# Patient Record
Sex: Female | Born: 1975 | Race: Black or African American | Hispanic: No | Marital: Single | Smoking: Current every day smoker
Health system: Southern US, Community
[De-identification: ages and names within clinical notes are randomized; demographics above are authoritative.]

## PROBLEM LIST (undated history)

## (undated) VITALS — BP 125/79 | HR 108 | Temp 98.0°F | Resp 16 | Ht 63.0 in | Wt 117.0 lb

## (undated) DIAGNOSIS — F329 Major depressive disorder, single episode, unspecified: Secondary | ICD-10-CM

## (undated) DIAGNOSIS — F32A Depression, unspecified: Secondary | ICD-10-CM

## (undated) DIAGNOSIS — F419 Anxiety disorder, unspecified: Secondary | ICD-10-CM

---

## 2005-01-27 ENCOUNTER — Emergency Department (HOSPITAL_COMMUNITY): Admission: EM | Admit: 2005-01-27 | Discharge: 2005-01-27 | Payer: Self-pay | Admitting: Emergency Medicine

## 2005-02-19 ENCOUNTER — Ambulatory Visit: Payer: Self-pay | Admitting: Family Medicine

## 2005-11-09 ENCOUNTER — Emergency Department (HOSPITAL_COMMUNITY): Admission: EM | Admit: 2005-11-09 | Discharge: 2005-11-09 | Payer: Self-pay | Admitting: Emergency Medicine

## 2005-12-05 ENCOUNTER — Inpatient Hospital Stay (HOSPITAL_COMMUNITY): Admission: AD | Admit: 2005-12-05 | Discharge: 2005-12-05 | Payer: Self-pay | Admitting: Obstetrics and Gynecology

## 2005-12-15 ENCOUNTER — Inpatient Hospital Stay (HOSPITAL_COMMUNITY): Admission: AD | Admit: 2005-12-15 | Discharge: 2005-12-15 | Payer: Self-pay | Admitting: Obstetrics

## 2006-01-30 ENCOUNTER — Inpatient Hospital Stay (HOSPITAL_COMMUNITY): Admission: AD | Admit: 2006-01-30 | Discharge: 2006-01-30 | Payer: Self-pay | Admitting: Obstetrics and Gynecology

## 2006-02-14 ENCOUNTER — Ambulatory Visit (HOSPITAL_COMMUNITY): Admission: RE | Admit: 2006-02-14 | Discharge: 2006-02-14 | Payer: Self-pay | Admitting: Obstetrics and Gynecology

## 2006-03-30 ENCOUNTER — Inpatient Hospital Stay (HOSPITAL_COMMUNITY): Admission: AD | Admit: 2006-03-30 | Discharge: 2006-03-31 | Payer: Self-pay | Admitting: Obstetrics and Gynecology

## 2006-04-01 ENCOUNTER — Ambulatory Visit: Payer: Self-pay | Admitting: Obstetrics and Gynecology

## 2006-04-01 ENCOUNTER — Inpatient Hospital Stay (HOSPITAL_COMMUNITY): Admission: AD | Admit: 2006-04-01 | Discharge: 2006-04-02 | Payer: Self-pay | Admitting: *Deleted

## 2006-04-16 ENCOUNTER — Ambulatory Visit: Payer: Self-pay | Admitting: Gynecology

## 2006-04-16 ENCOUNTER — Inpatient Hospital Stay (HOSPITAL_COMMUNITY): Admission: AD | Admit: 2006-04-16 | Discharge: 2006-04-24 | Payer: Self-pay | Admitting: Obstetrics & Gynecology

## 2006-04-24 ENCOUNTER — Ambulatory Visit: Payer: Self-pay | Admitting: Internal Medicine

## 2006-06-10 ENCOUNTER — Emergency Department (HOSPITAL_COMMUNITY): Admission: EM | Admit: 2006-06-10 | Discharge: 2006-06-10 | Payer: Self-pay | Admitting: Family Medicine

## 2007-05-03 IMAGING — US US OB FOLLOW-UP
1 series · 13 of 28 positions shown · non-contrast
Comparison: none

CLINICAL DATA: 26 weeks pregnant, fluid leaking per vagina.

[Series 1: us ob follow-up · 0.22mm/px · 13 of 53 slices shown]
[im 2/53]
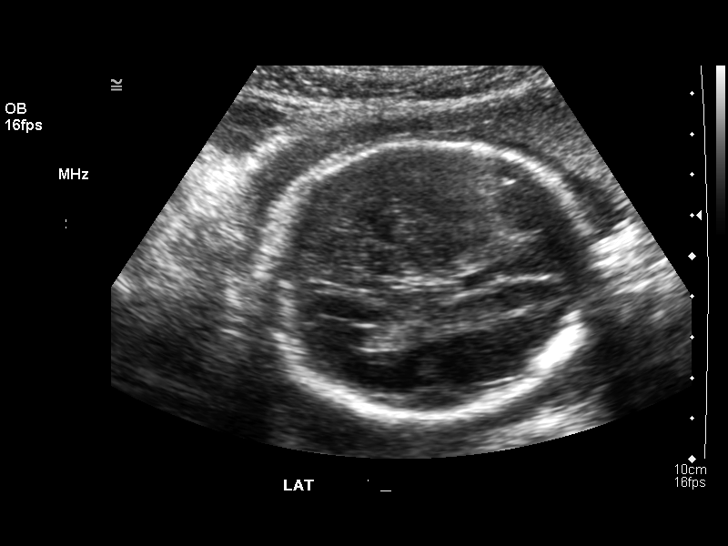
[im 6/53]
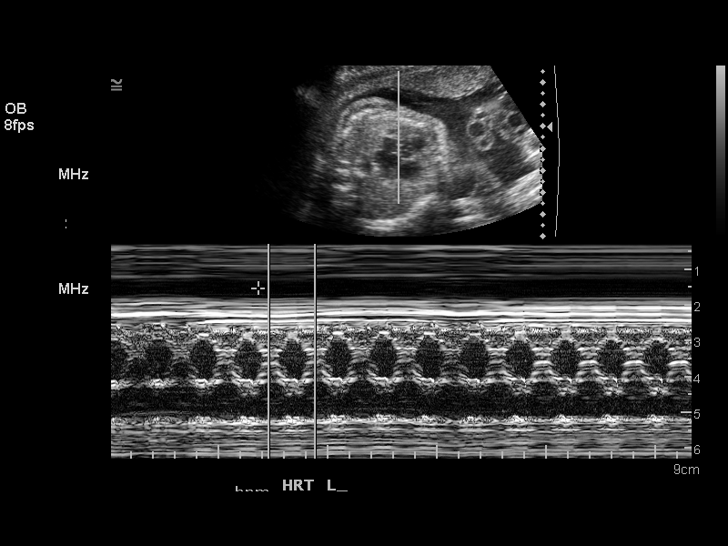
[im 10/53]
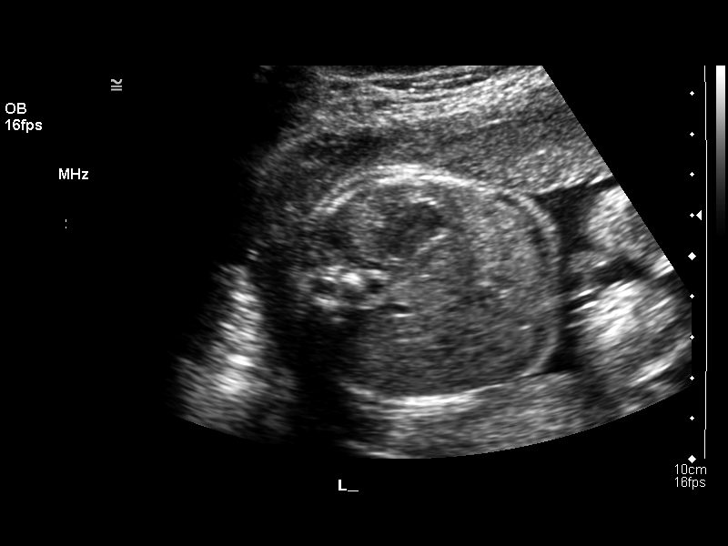
[im 14/53]
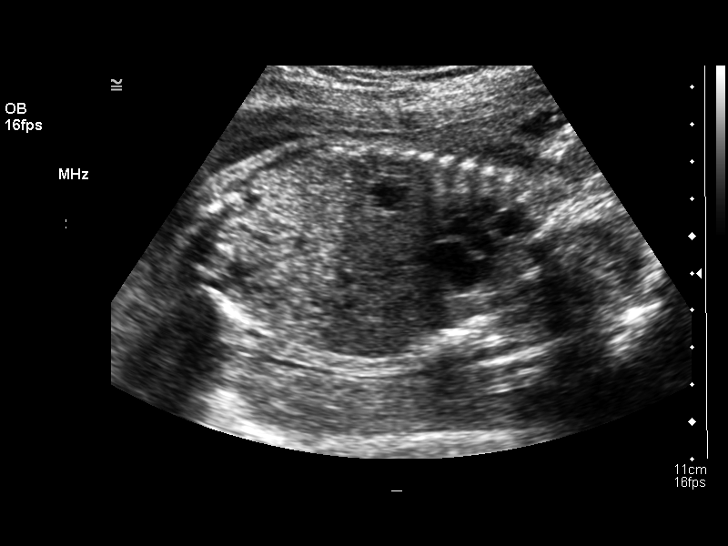
[im 18/53]
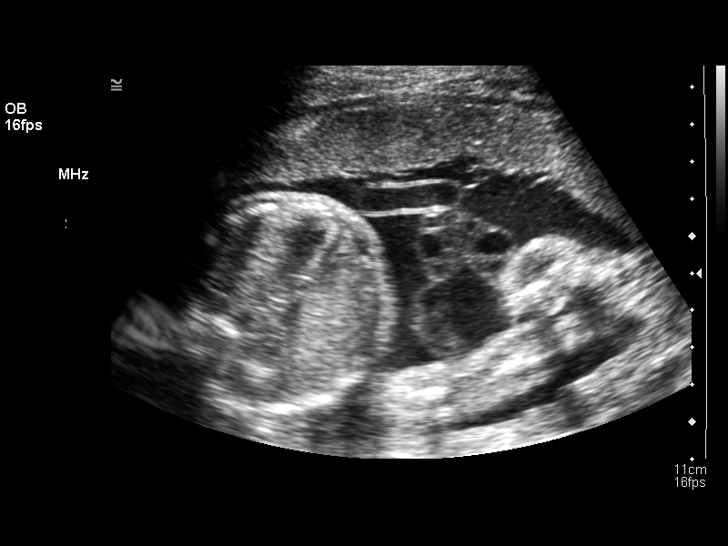
[im 22/53]
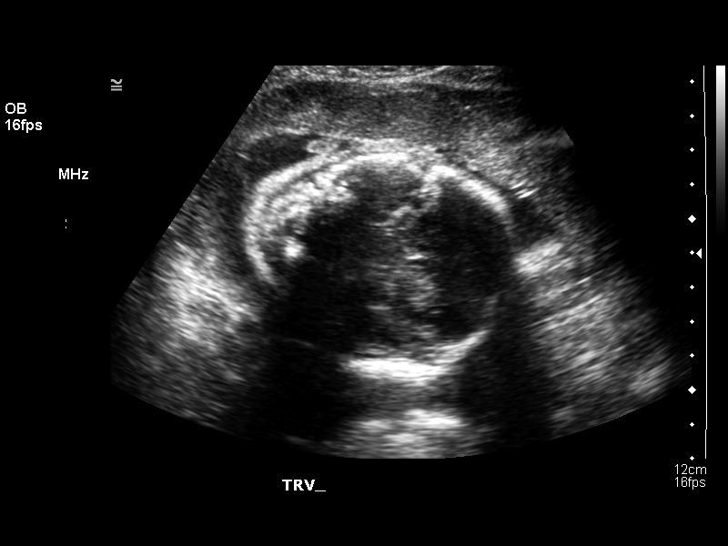
[im 27/53]
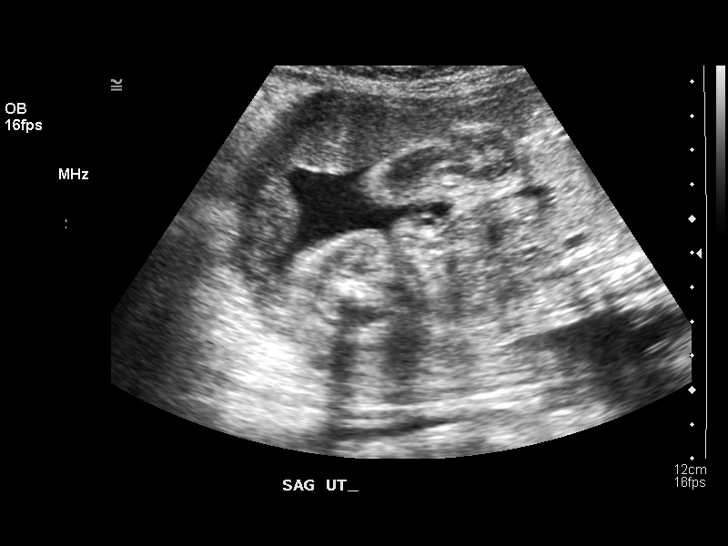
[im 31/53]
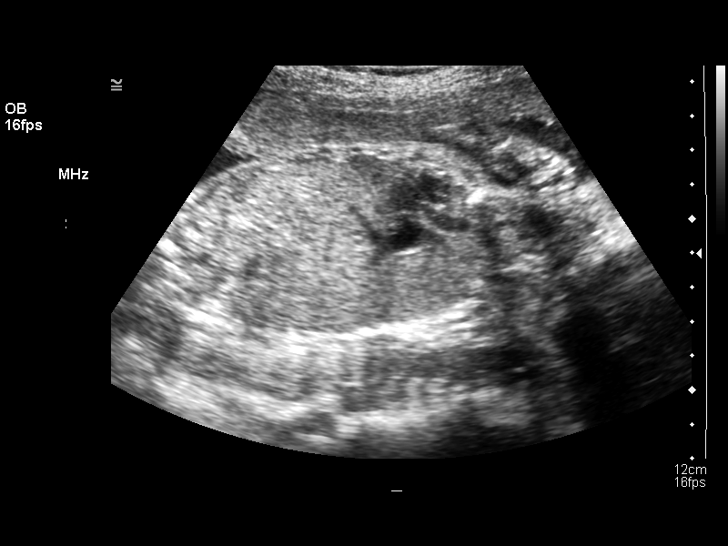
[im 35/53]
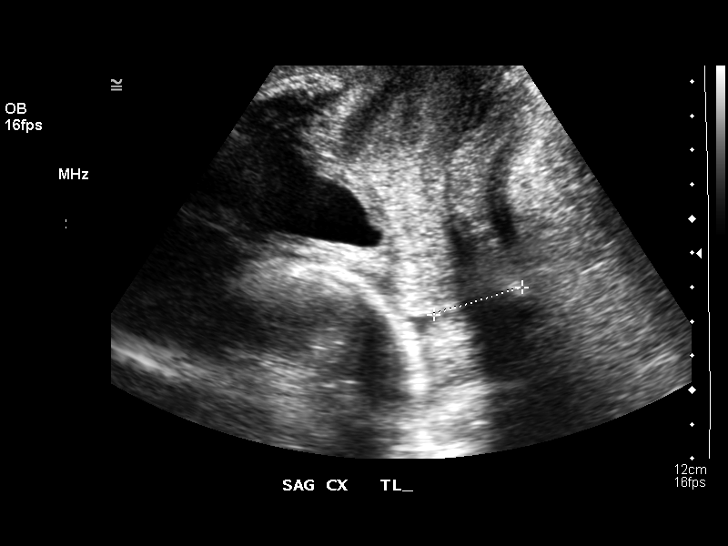
[im 39/53]
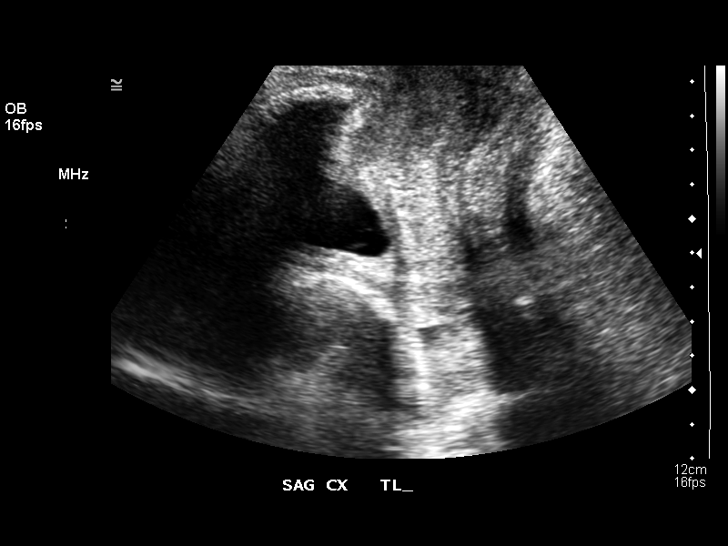
[im 43/53]
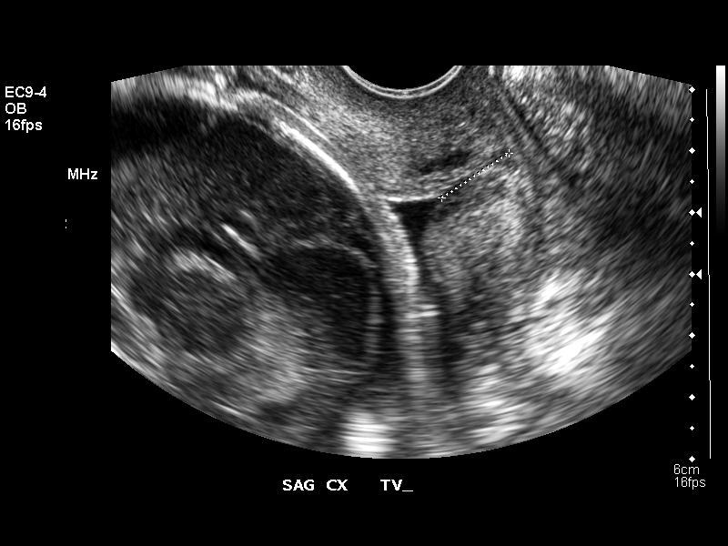
[im 47/53]
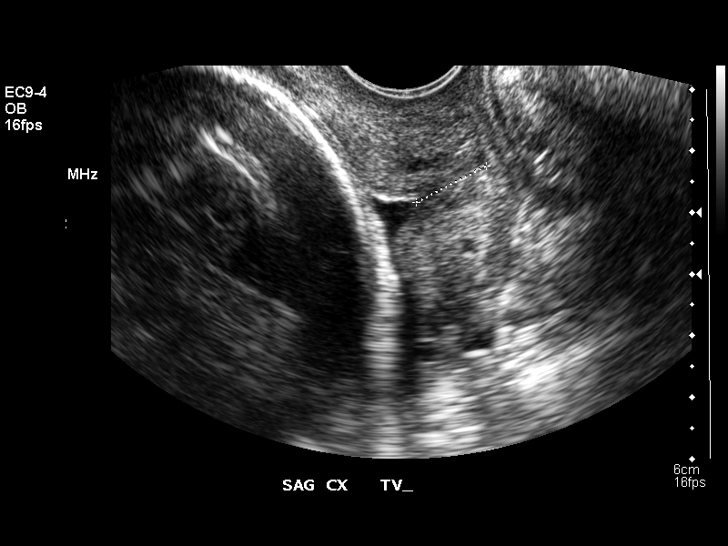
[im 51/53]
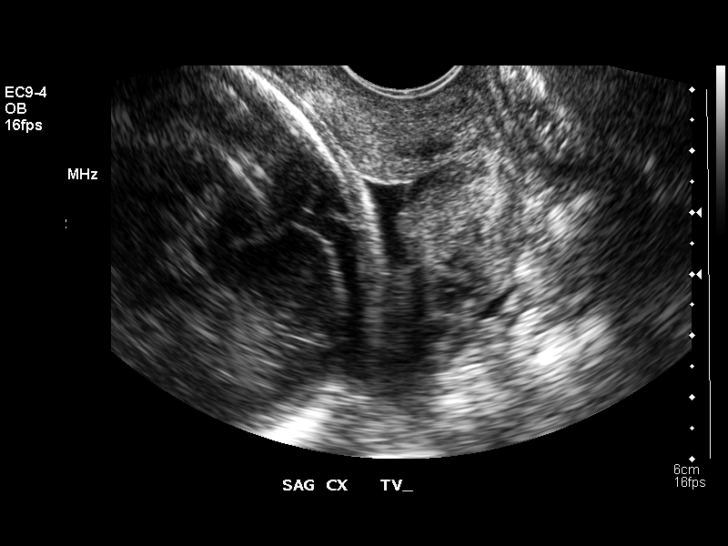

[13 of 28 positions shown; findings below may reference images not displayed]

OBSTETRICAL ULTRASOUND RE-EVALUATION WITH TRANSVAGINAL:
Number of Fetuses:  1
Heart Rate:  141
Movement:  Yes
Breathing:  No
Presentation:  Cephalic
Placental Location:  Anterior
Grade:  I
Previa:  No
Amniotic Fluid (subjective):  Normal
Amniotic Fluid (objective):  11.4 cm AFI (5th -95th%ile = 9.7 ? 22.3 cm for 26 wks)

FETAL BIOMETRY
BPD:  6.5 cm   26 w 2 d
HC:  24.9 cm   27 w 0 d
AC:   21.4 cm  25 w 6 d
FL:  4.9 cm   26 w 3 d

Mean GA:  26 w 3 d  US EDC:  07/03/06
Assigned GA:  26 w 0 d  Assigned EDC:  07/06/06

EFW:  908 g 

FETAL ANATOMY
Lateral Ventricles:  Visualized 
Thalami/CSP:  Visualized 
Posterior Fossa:  Previously seen 
Nuchal Region:  N/A
Spine:  Previously seen 
4 Chamber Heart on Left:  Previously seen 
Stomach on Left:  Visualized 
3 Vessel Cord:  Previously seen 
Cord Insertion Site:  Previously seen 
Kidneys:  Visualized 
Bladder:  Visualized 
Extremities:  Previously seen 

MATERNAL UTERINE AND ADNEXAL FINDINGS
Cervix:  1.3 ? 1.5 cm Transvaginally
Anterior fundal fibroid measuring 2.4 x 1.6 x 1.4 cm.
IMPRESSION: 1.  Single live intrauterine gestation in cephalic presentation with average ultrasound age of 26 weeks 3 days corresponding to an estimated due date of 07/03/06.  This is concordant with the gestational age by first ultrasound of 26 weeks 0 days (EDC by first ultrasound 07/06/06).  
2.  The cervix is shortened to a maximal length of 1.3 cm with dynamic changes and funneling noted on transvaginal technique.

## 2007-05-20 IMAGING — US US OB LIMITED
1 series · 13 of 28 positions shown · non-contrast
Comparison: none

CLINICAL DATA: 28 week 3 day assigned gestational age by prior ultrasound.  Pregnancy induced hypertension.  Low platelets.  Evaluate amniotic fluid, BPP and fetal Doppler.

[Series 1: us ob limited · 0.35mm/px · 13 of 44 slices shown]
[im 2/44]
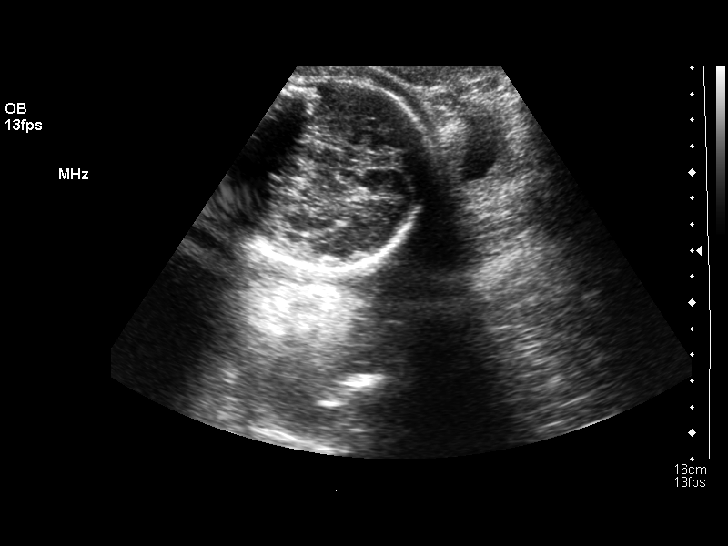
[im 5/44]
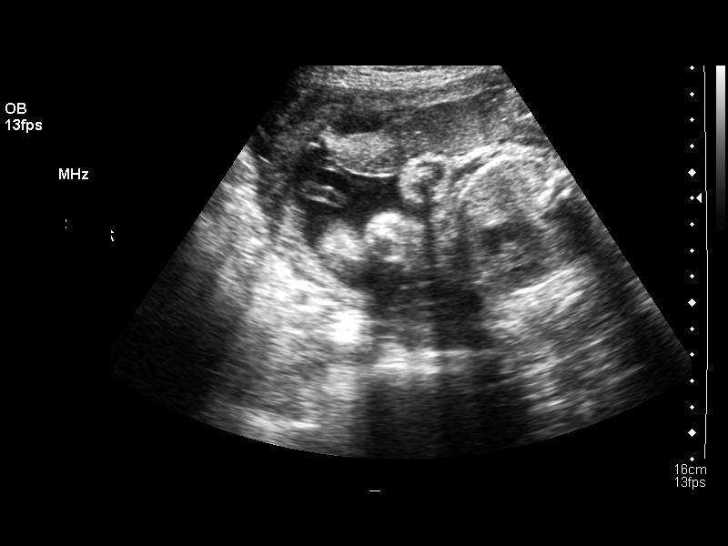
[im 8/44]
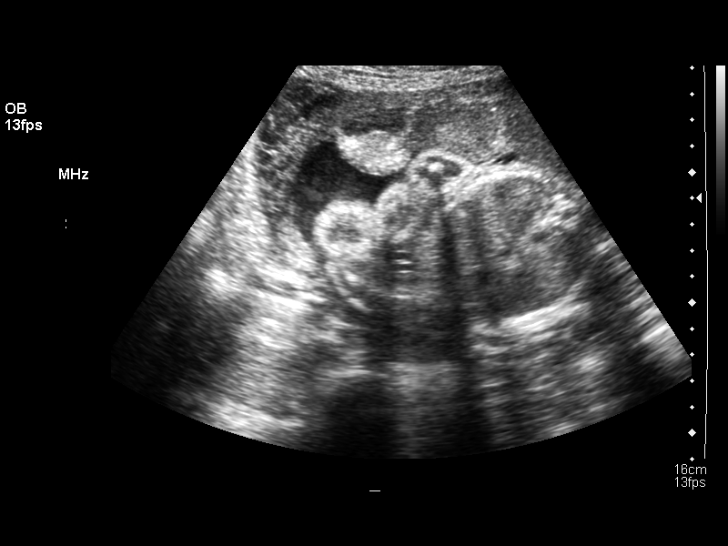
[im 12/44]
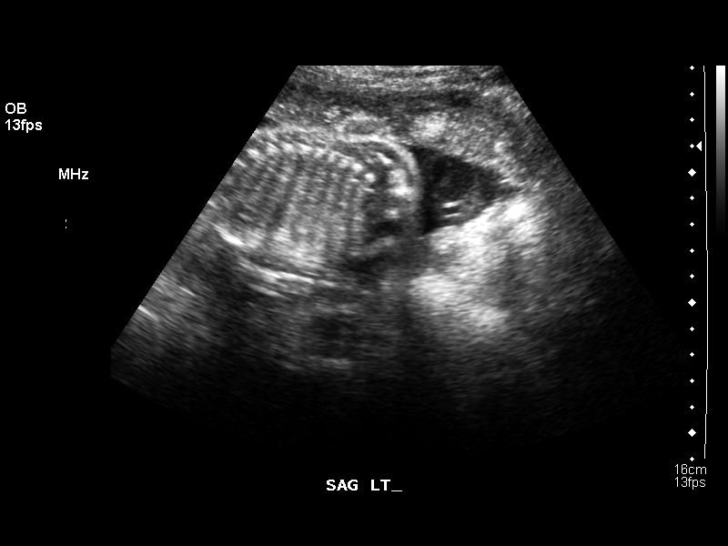
[im 15/44]
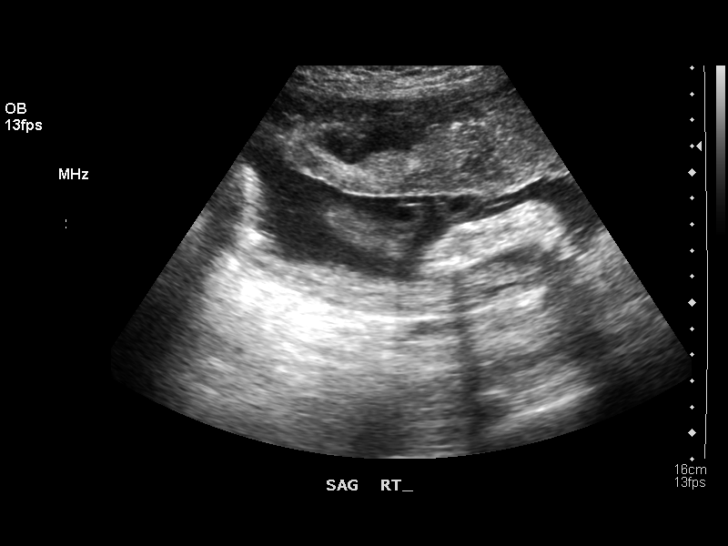
[im 18/44]
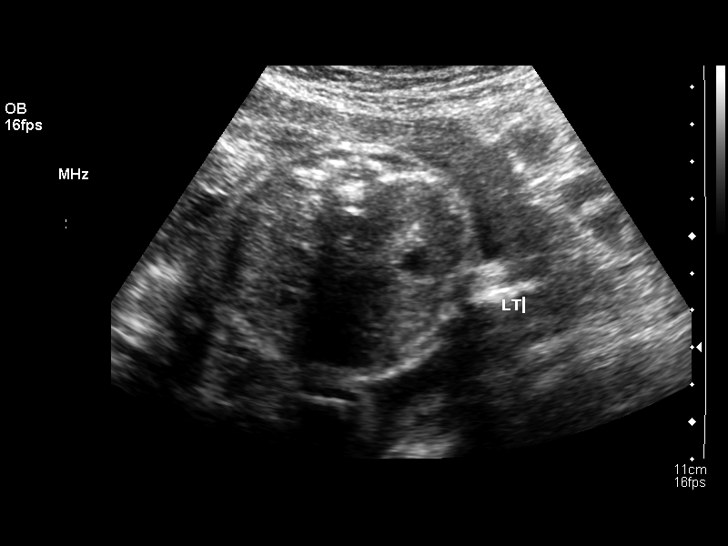
[im 23/44]
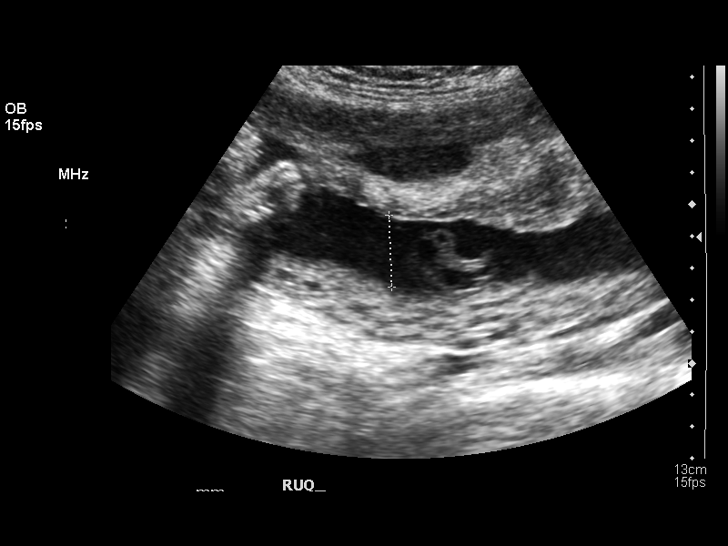
[im 26/44]
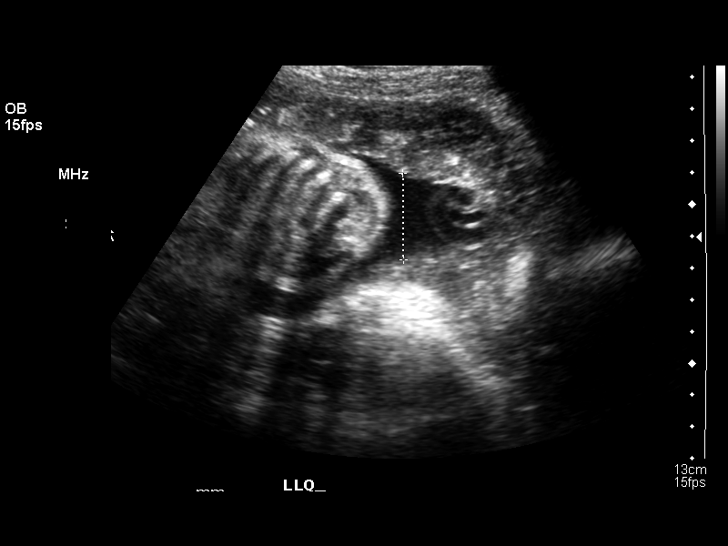
[im 29/44]
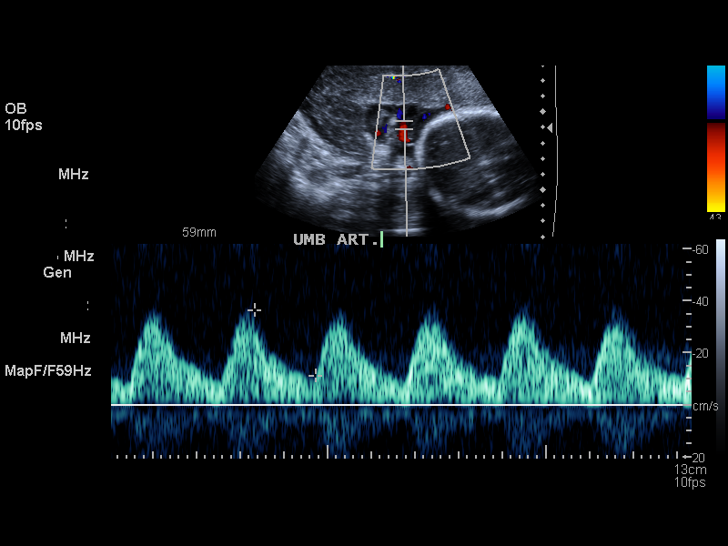
[im 32/44]
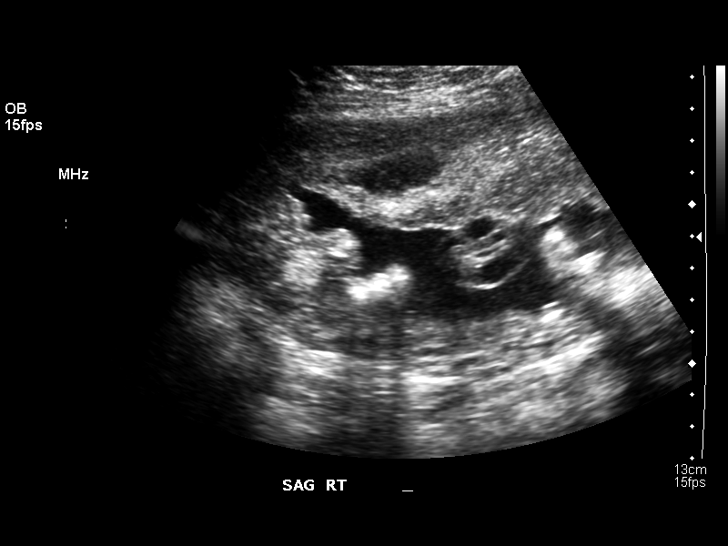
[im 36/44]
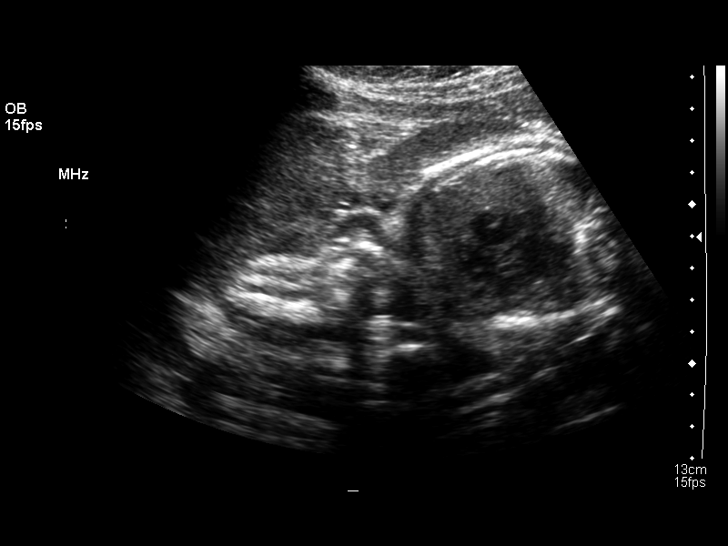
[im 39/44]
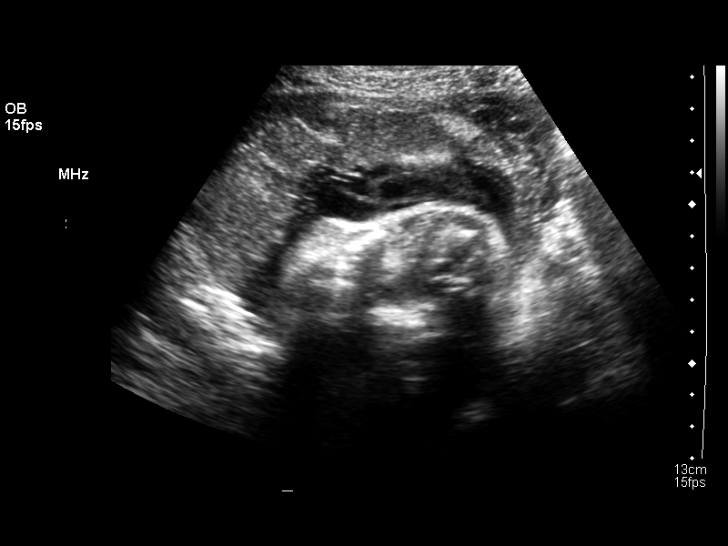
[im 42/44]
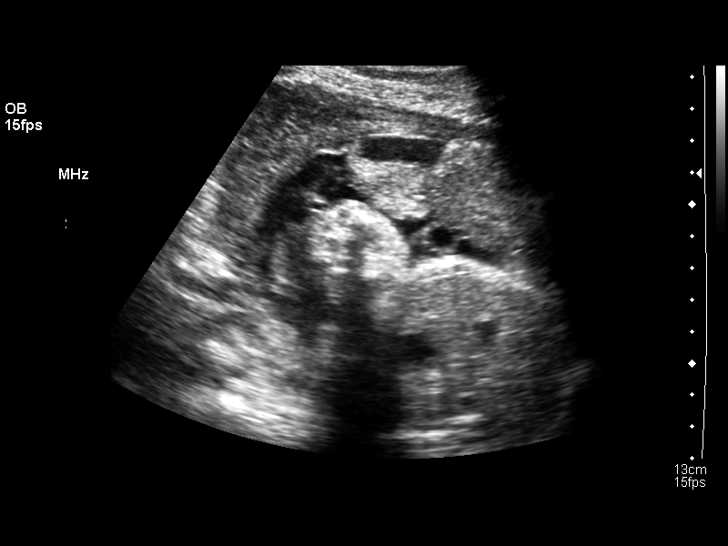

[13 of 28 positions shown; findings below may reference images not displayed]

LIMITED OBSTETRICAL ULTRASOUND:
Number of Fetuses:  1
Heart Rate:  131 BPM
Movement:  Yes
Breathing:  Yes
Presentation:  Cephalic
Placental Location:  Anterior
Grade:  I
Previa:  No
Amniotic Fluid (Subjective):  Borderline decreased 
Amniotic Fluid (Objective):  AFI 9.1 cm (3th-43th %ile = 9.4 to 22.8 cm for 28 weeks) 

Fetal measurements and complete anatomic evaluation were not requested.  The following fetal anatomy was visualized during this exam:  Lateral ventricles, 4-chamber heart, stomach, kidneys, and bladder.

MATERNAL UTERINE AND ADNEXAL FINDINGS
Cervix:  Not evaluated
IMPRESSION: Single living intrauterine fetus in cephalic presentation.  Borderline decreased amniotic fluid volume, with AFI of 9.1 cm which is at approximately 5th %ile for gestational age.

BIOPHYSICAL PROFILE

Movement:    2  Time:  20 minutes
Breathing:  2
Tone:  2
Amniotic Fluid:  2

Total Score:  8
IMPRESSION: Biophysical profile score of [DATE].
DOPPLER ULTRASOUND OF FETUS:

Umbilical Artery S/D Ratio:  3.3 (NL< 4.55)
IMPRESSION: Umbilical artery S/D ratio is within normal limits for gestational age.

## 2007-05-23 IMAGING — CR DG CHEST 2V
2 series · 2 of 2 positions shown · non-contrast
Comparison: none

CLINICAL DATA: Status post cesarean section.   Pregnancies.  Hypertension.  Fever of unknown origin.  
 CHEST - 2 VIEW:

[view not recorded (1 of 2)]
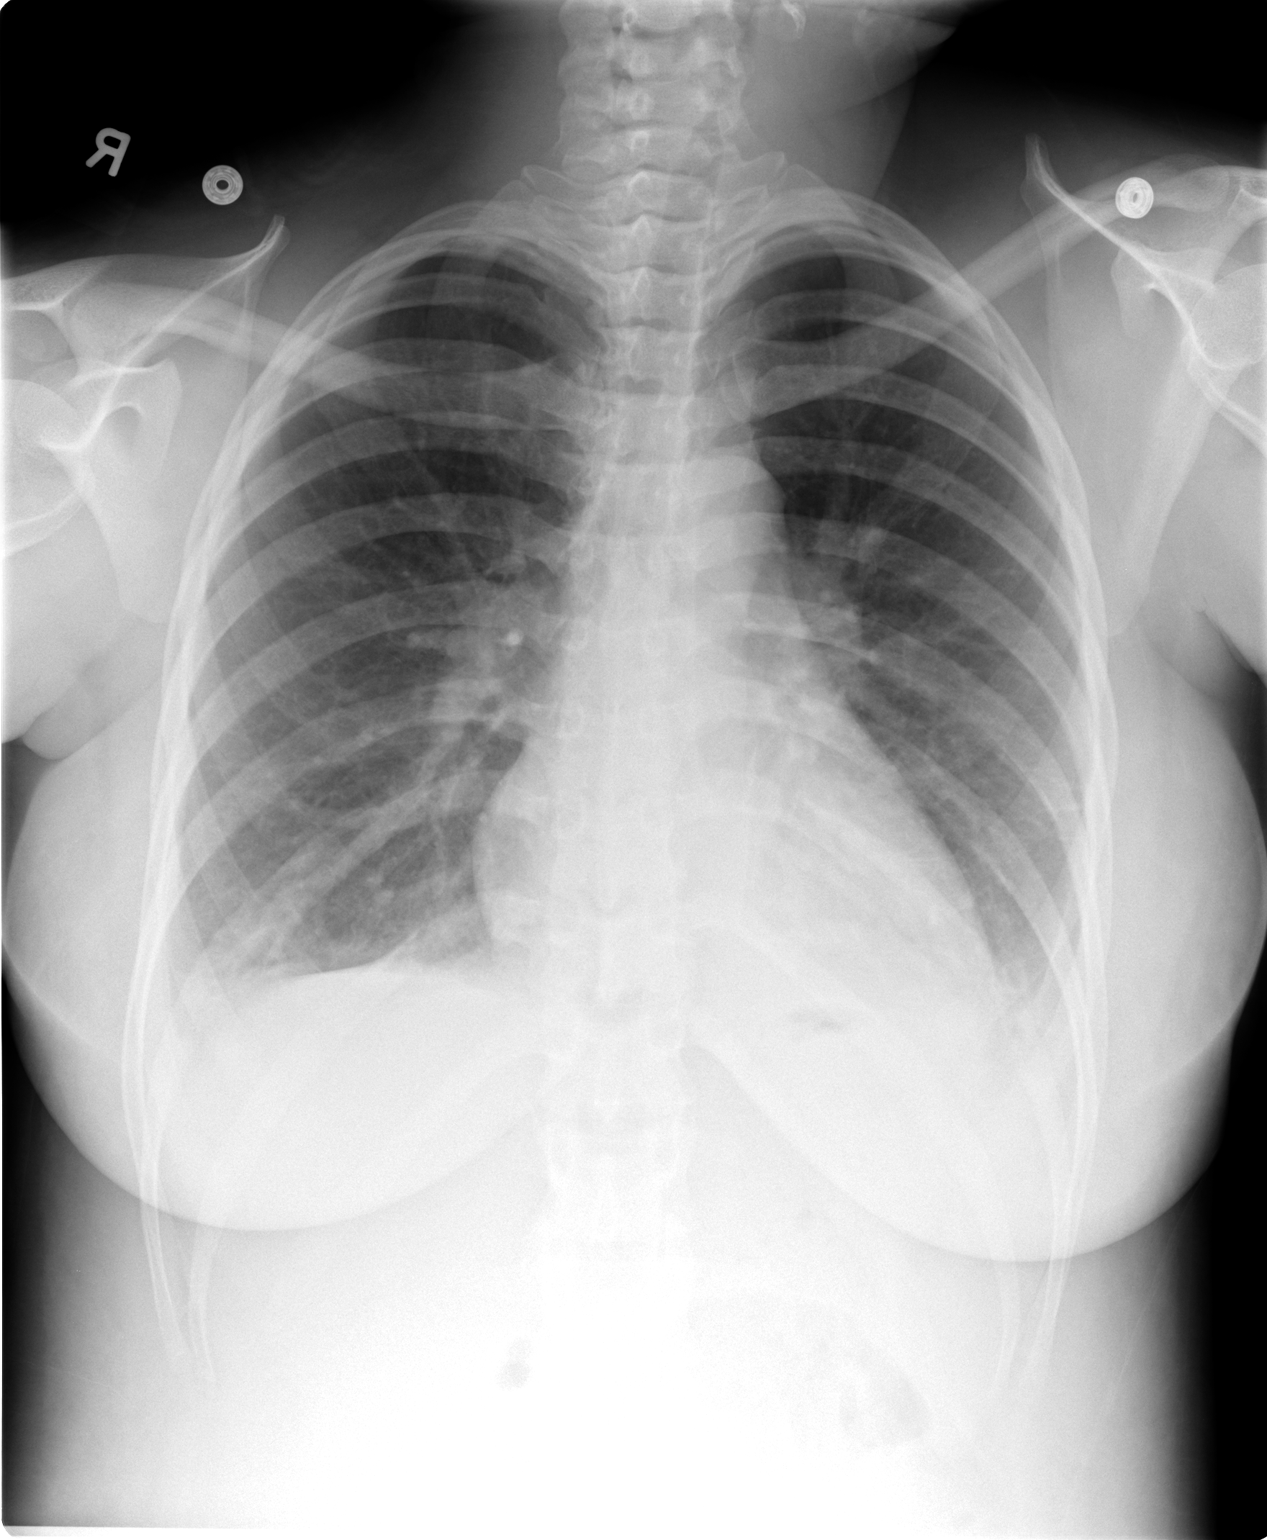

[view not recorded (2 of 2)]
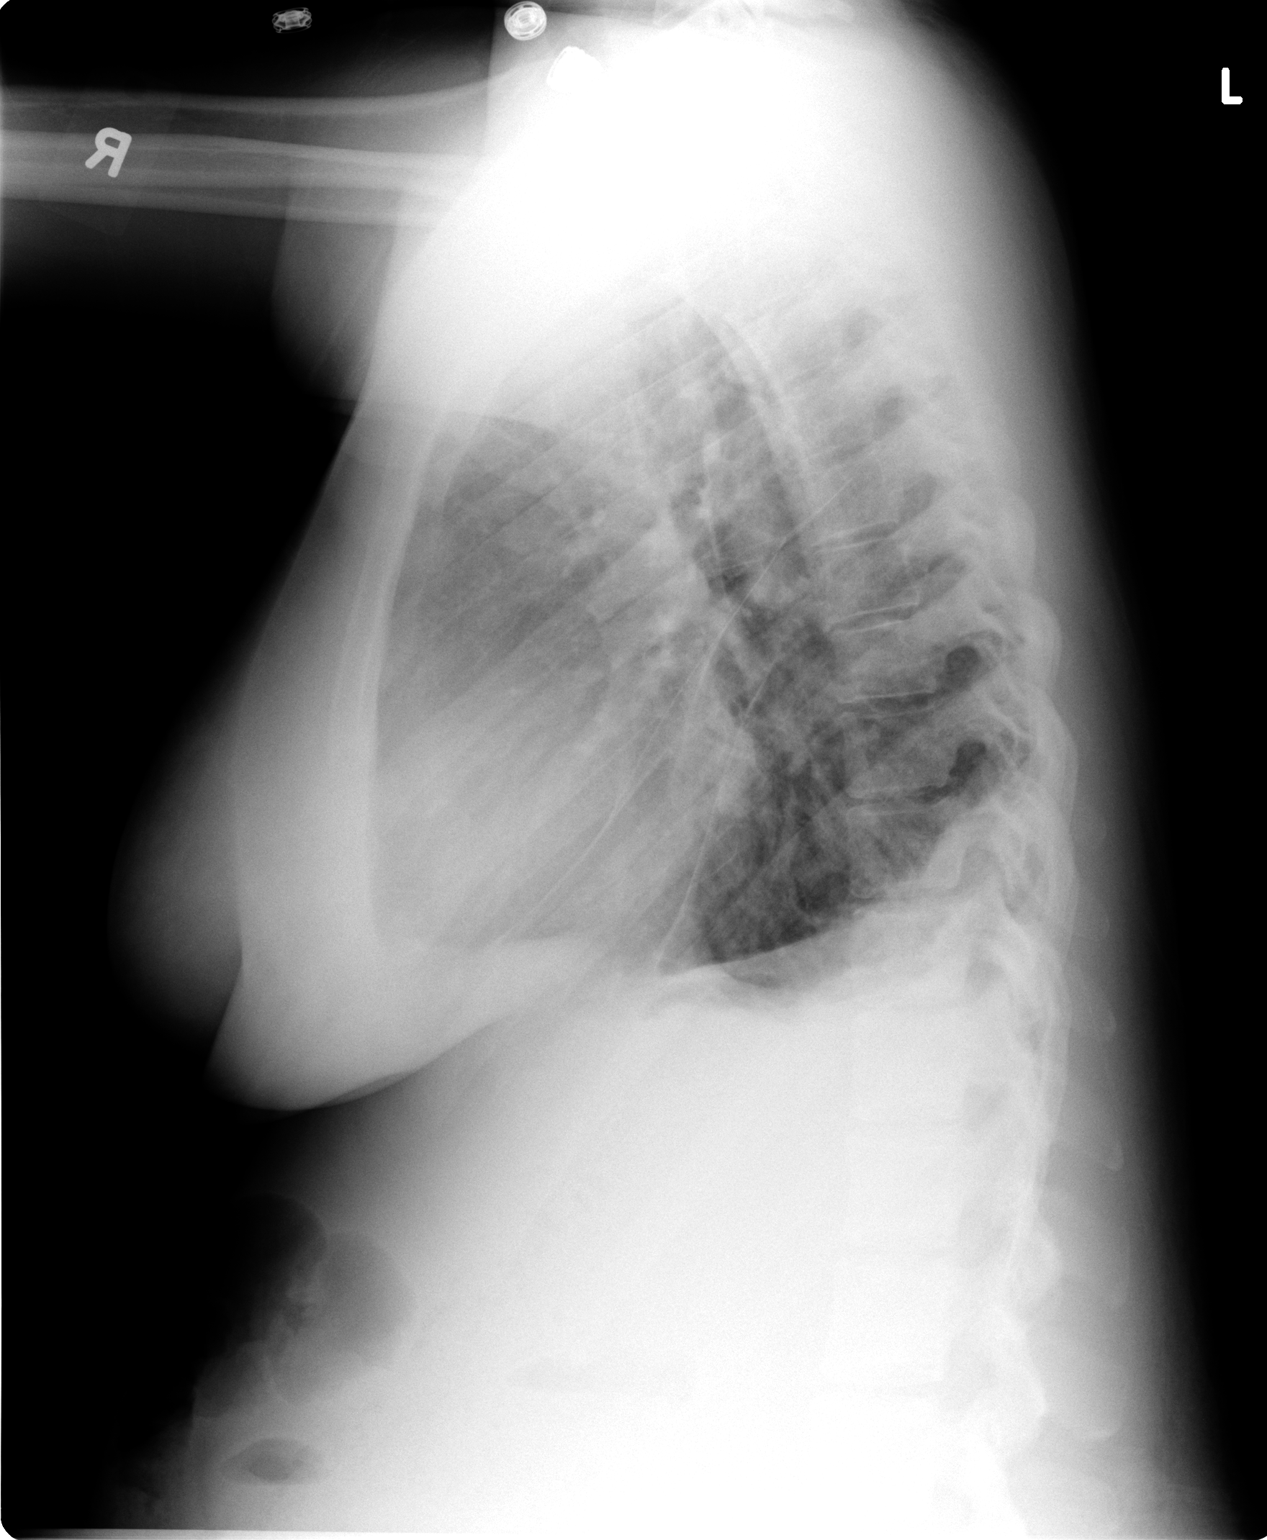

[2 of 2 positions shown; findings below may reference images not displayed]

FINDINGS: Small pleural effusions are present bilaterally.  Pulmonary vascular congestion and mild interstitial edema pattern is also noted.  Heart size is at the upper limits of normal.
IMPRESSION: Mild interstitial edema and small bilateral pleural effusions.  Borderline cardiomegaly.

## 2012-06-03 ENCOUNTER — Encounter (HOSPITAL_COMMUNITY): Payer: Self-pay | Admitting: *Deleted

## 2012-06-03 ENCOUNTER — Emergency Department (HOSPITAL_COMMUNITY)
Admission: EM | Admit: 2012-06-03 | Discharge: 2012-06-04 | Disposition: A | Discharge status: Other Acute Inpatient Hospital | Payer: Medicaid Other | Attending: Emergency Medicine | Admitting: Emergency Medicine

## 2012-06-03 DIAGNOSIS — S61509A Unspecified open wound of unspecified wrist, initial encounter: Secondary | ICD-10-CM | POA: Insufficient documentation

## 2012-06-03 DIAGNOSIS — F3289 Other specified depressive episodes: Secondary | ICD-10-CM | POA: Insufficient documentation

## 2012-06-03 DIAGNOSIS — F329 Major depressive disorder, single episode, unspecified: Secondary | ICD-10-CM | POA: Insufficient documentation

## 2012-06-03 DIAGNOSIS — T1491XA Suicide attempt, initial encounter: Secondary | ICD-10-CM

## 2012-06-03 DIAGNOSIS — X789XXA Intentional self-harm by unspecified sharp object, initial encounter: Secondary | ICD-10-CM | POA: Insufficient documentation

## 2012-06-03 DIAGNOSIS — R5381 Other malaise: Secondary | ICD-10-CM | POA: Insufficient documentation

## 2012-06-03 DIAGNOSIS — F489 Nonpsychotic mental disorder, unspecified: Secondary | ICD-10-CM | POA: Insufficient documentation

## 2012-06-03 LAB — CBC
HCT: 40.1 % (ref 36.0–46.0)
Hemoglobin: 13.7 g/dL (ref 12.0–15.0)
MCH: 32.5 pg (ref 26.0–34.0)
MCHC: 34.2 g/dL (ref 30.0–36.0)
MCV: 95.2 fL (ref 78.0–100.0)
Platelets: 211 10*3/uL (ref 150–400)
RBC: 4.21 MIL/uL (ref 3.87–5.11)
RDW: 13.9 % (ref 11.5–15.5)
WBC: 5.2 10*3/uL (ref 4.0–10.5)

## 2012-06-03 LAB — COMPREHENSIVE METABOLIC PANEL
ALT: 11 U/L (ref 0–35)
AST: 19 U/L (ref 0–37)
Albumin: 4.6 g/dL (ref 3.5–5.2)
Alkaline Phosphatase: 41 U/L (ref 39–117)
BUN: 13 mg/dL (ref 6–23)
CO2: 21 mEq/L (ref 19–32)
Calcium: 9.7 mg/dL (ref 8.4–10.5)
Chloride: 104 mEq/L (ref 96–112)
Creatinine, Ser: 0.85 mg/dL (ref 0.50–1.10)
GFR calc Af Amer: >90 mL/min (ref >90)
GFR calc non Af Amer: 88 mL/min — ABNORMAL LOW (ref >90)
Glucose, Bld: 97 mg/dL (ref 70–99)
Potassium: 4.1 mEq/L (ref 3.5–5.1)
Sodium: 138 mEq/L (ref 135–145)
Total Bilirubin: 0.3 mg/dL (ref 0.3–1.2)
Total Protein: 7.8 g/dL (ref 6.0–8.3)

## 2012-06-03 LAB — PREGNANCY, URINE: Preg Test, Ur: NEGATIVE

## 2012-06-03 LAB — RAPID URINE DRUG SCREEN, HOSP PERFORMED
Amphetamines: NOT DETECTED
Barbiturates: NOT DETECTED
Benzodiazepines: NOT DETECTED
Cocaine: NOT DETECTED
Opiates: NOT DETECTED
Tetrahydrocannabinol: POSITIVE — AB

## 2012-06-03 LAB — DIFFERENTIAL
Basophils Absolute: 0 10*3/uL (ref 0.0–0.1)
Basophils Relative: 0 % (ref 0–1)
Eosinophils Absolute: 0.1 10*3/uL (ref 0.0–0.7)
Eosinophils Relative: 2 % (ref 0–5)
Lymphocytes Relative: 60 % — ABNORMAL HIGH (ref 12–46)
Lymphs Abs: 3.1 10*3/uL (ref 0.7–4.0)
Monocytes Absolute: 0.4 10*3/uL (ref 0.1–1.0)
Monocytes Relative: 7 % (ref 3–12)
Neutro Abs: 1.6 10*3/uL — ABNORMAL LOW (ref 1.7–7.7)
Neutrophils Relative %: 30 % — ABNORMAL LOW (ref 43–77)

## 2012-06-03 LAB — ETHANOL: Alcohol, Ethyl (B): <11 mg/dL (ref 0–11)

## 2012-06-03 NOTE — ED Notes (Signed)
The pt cut both her wrists approx 1600 today..  Superficial cuts no active bleeding

## 2012-06-03 NOTE — ED Notes (Signed)
The pt undressed placed in blue scrubs.  wanded .  The pt went ot the br and came out with the cut on her rt wrist bleeding.  Pressure bandage placed bleeding controlled house coverage and chg notified of the need for a sitter

## 2012-06-04 ENCOUNTER — Encounter (HOSPITAL_COMMUNITY): Payer: Self-pay

## 2012-06-04 ENCOUNTER — Inpatient Hospital Stay (HOSPITAL_COMMUNITY)
Admission: AD | Admit: 2012-06-04 | Discharge: 2012-06-07 | DRG: 885 | Disposition: A | Discharge status: Home / Self Care | Payer: Medicaid Other | Source: Ambulatory Visit | Attending: Psychiatry | Admitting: Psychiatry

## 2012-06-04 DIAGNOSIS — F340 Cyclothymic disorder: Secondary | ICD-10-CM

## 2012-06-04 DIAGNOSIS — F17200 Nicotine dependence, unspecified, uncomplicated: Secondary | ICD-10-CM | POA: Diagnosis present

## 2012-06-04 DIAGNOSIS — Z91199 Patient's noncompliance with other medical treatment and regimen due to unspecified reason: Secondary | ICD-10-CM

## 2012-06-04 DIAGNOSIS — F3130 Bipolar disorder, current episode depressed, mild or moderate severity, unspecified: Principal | ICD-10-CM | POA: Diagnosis present

## 2012-06-04 DIAGNOSIS — S61509A Unspecified open wound of unspecified wrist, initial encounter: Secondary | ICD-10-CM | POA: Diagnosis present

## 2012-06-04 DIAGNOSIS — F319 Bipolar disorder, unspecified: Secondary | ICD-10-CM | POA: Diagnosis present

## 2012-06-04 DIAGNOSIS — X789XXA Intentional self-harm by unspecified sharp object, initial encounter: Secondary | ICD-10-CM | POA: Diagnosis present

## 2012-06-04 DIAGNOSIS — Z9119 Patient's noncompliance with other medical treatment and regimen: Secondary | ICD-10-CM

## 2012-06-04 DIAGNOSIS — F429 Obsessive-compulsive disorder, unspecified: Secondary | ICD-10-CM | POA: Diagnosis present

## 2012-06-04 DIAGNOSIS — F1210 Cannabis abuse, uncomplicated: Secondary | ICD-10-CM | POA: Diagnosis present

## 2012-06-04 DIAGNOSIS — T1491XA Suicide attempt, initial encounter: Secondary | ICD-10-CM

## 2012-06-04 DIAGNOSIS — F332 Major depressive disorder, recurrent severe without psychotic features: Secondary | ICD-10-CM | POA: Diagnosis present

## 2012-06-04 DIAGNOSIS — G4700 Insomnia, unspecified: Secondary | ICD-10-CM | POA: Diagnosis present

## 2012-06-04 HISTORY — DX: Anxiety disorder, unspecified: F41.9

## 2012-06-04 HISTORY — DX: Depression, unspecified: F32.A

## 2012-06-04 HISTORY — DX: Major depressive disorder, single episode, unspecified: F32.9

## 2012-06-04 LAB — ACETAMINOPHEN LEVEL
Acetaminophen (Tylenol), Serum: 23.9 ug/mL (ref 10–30)
Acetaminophen (Tylenol), Serum: <15 ug/mL (ref 10–30)

## 2012-06-04 LAB — SALICYLATE LEVEL: Salicylate Lvl: <2 mg/dL — ABNORMAL LOW (ref 2.8–20.0)

## 2012-06-04 LAB — URINALYSIS, ROUTINE W REFLEX MICROSCOPIC
Glucose, UA: NEGATIVE mg/dL
Hgb urine dipstick: NEGATIVE
Ketones, ur: NEGATIVE mg/dL
Leukocytes, UA: NEGATIVE
Nitrite: NEGATIVE
Protein, ur: NEGATIVE mg/dL
Specific Gravity, Urine: >1.046 — ABNORMAL HIGH (ref 1.005–1.030)
Urobilinogen, UA: 0.2 mg/dL (ref 0.0–1.0)
pH: 5 (ref 5.0–8.0)

## 2012-06-04 LAB — URINE MICROSCOPIC-ADD ON

## 2012-06-04 MED ORDER — ALUM & MAG HYDROXIDE-SIMETH 200-200-20 MG/5ML PO SUSP: 30.0000 mL | ORAL | Status: DC | PRN

## 2012-06-04 MED ORDER — ACETAMINOPHEN 325 MG PO TABS: 650.0000 mg | ORAL_TABLET | Freq: Four times a day (QID) | ORAL | Status: DC | PRN

## 2012-06-04 MED ORDER — TETANUS-DIPHTH-ACELL PERTUSSIS 5-2.5-18.5 LF-MCG/0.5 IM SUSP: 0.5000 mL | Freq: Once | INTRAMUSCULAR | Status: DC

## 2012-06-04 MED ORDER — CITALOPRAM HYDROBROMIDE 20 MG PO TABS
20.0000 mg | ORAL_TABLET | Freq: Every day | ORAL | Status: DC
  Administered 2012-06-04 – 2012-06-05 (×2): 20 mg via ORAL
  Filled 2012-06-04 (×4): qty 1

## 2012-06-04 MED ORDER — NICOTINE 14 MG/24HR TD PT24
14.0000 mg | MEDICATED_PATCH | Freq: Every day | TRANSDERMAL | Status: DC
  Administered 2012-06-05 – 2012-06-07 (×3): 14 mg via TRANSDERMAL
  Filled 2012-06-04: qty 14
  Filled 2012-06-04 (×2): qty 1
  Filled 2012-06-04: qty 14
  Filled 2012-06-04 (×2): qty 1

## 2012-06-04 MED ORDER — TETANUS-DIPHTH-ACELL PERTUSSIS 5-2.5-18.5 LF-MCG/0.5 IM SUSP
INTRAMUSCULAR | Status: AC
  Filled 2012-06-04: qty 0.5

## 2012-06-04 MED ORDER — NICOTINE 21 MG/24HR TD PT24: 21.0000 mg | MEDICATED_PATCH | Freq: Every day | TRANSDERMAL | Status: DC

## 2012-06-04 MED ORDER — ACETAMINOPHEN 325 MG PO TABS: 650.0000 mg | ORAL_TABLET | ORAL | Status: DC | PRN

## 2012-06-04 MED ORDER — TRAZODONE HCL 50 MG PO TABS
50.0000 mg | ORAL_TABLET | Freq: Every evening | ORAL | Status: DC | PRN
  Administered 2012-06-04: 50 mg via ORAL
  Filled 2012-06-04: qty 1
  Filled 2012-06-04: qty 14
  Filled 2012-06-04: qty 1

## 2012-06-04 MED ORDER — ONDANSETRON HCL 8 MG PO TABS: 4.0000 mg | ORAL_TABLET | Freq: Three times a day (TID) | ORAL | Status: DC | PRN

## 2012-06-04 MED ORDER — LORAZEPAM 1 MG PO TABS: 1.0000 mg | ORAL_TABLET | Freq: Three times a day (TID) | ORAL | Status: DC | PRN

## 2012-06-04 MED ORDER — CARBAMAZEPINE 200 MG PO TABS
200.0000 mg | ORAL_TABLET | Freq: Every day | ORAL | Status: DC
  Administered 2012-06-04: 200 mg via ORAL
  Filled 2012-06-04 (×3): qty 1

## 2012-06-04 MED ORDER — MAGNESIUM HYDROXIDE 400 MG/5ML PO SUSP: 30.0000 mL | Freq: Every day | ORAL | Status: DC | PRN

## 2012-06-04 MED ORDER — ZOLPIDEM TARTRATE 5 MG PO TABS: 5.0000 mg | ORAL_TABLET | Freq: Every evening | ORAL | Status: DC | PRN

## 2012-06-04 MED ORDER — IBUPROFEN 200 MG PO TABS: 600.0000 mg | ORAL_TABLET | Freq: Three times a day (TID) | ORAL | Status: DC | PRN

## 2012-06-04 NOTE — ED Provider Notes (Signed)
Medical screening examination/treatment/procedure(s) were performed by non-physician practitioner and as supervising physician I was immediately available for consultation/collaboration.  Olivia Mackie, MD 06/04/12 [phone]

## 2012-06-04 NOTE — Progress Notes (Signed)
[facility] MD Progress Note  06/04/2012 7:14 PM  Diagnosis:   Axis I: Major Depression, Recurrent severe and Rule out cyclothymia vs Bipolar disorder Axis II: Deferred Axis III:  Past Medical History  Diagnosis Date  . Depression   . Anxiety     ADL's:  Impaired  Sleep: Fair  Appetite:  Fair  Suicidal Ideation:  Pt denies any thoughts of being dead now, but did attempt to end her life just a day ago. Homicidal Ideation:  Denies adamantly any homicidal thoughts.  Mental Status Examination/Evaluation: Objective:  Appearance: Disheveled  Eye Contact::  Fair  Speech:  Clear and Coherent  Volume:  Normal  Mood:  Euthymic, "for some reason??"  Affect:  Blunt  Thought Process:  Coherent  Orientation:  Full  Thought Content:  WDL  Suicidal Thoughts:  No  Homicidal Thoughts:  No  Memory:  Immediate;   Fair  Judgement:  Impaired  Insight:  Lacking  Psychomotor Activity:  Normal  Concentration:  Fair  Recall:  Fair  Akathisia:  No  Handed:  Right  AIMS (if indicated):     Assets:  Communication Skills Desire for Improvement  Sleep:      Vital Signs:Blood pressure 121/68, pulse 102, temperature 97.8 F (36.6 C), temperature source Oral, height 5\' 3"  (1.6 m), weight 53.071 kg (117 lb), last menstrual period 05/13/2012. Current Medications: Current Facility-Administered Medications  Medication Dose Route Frequency Provider Last Rate Last Dose  . acetaminophen (TYLENOL) tablet 650 mg  650 mg Oral Q6H PRN Sanjuana Kava, NP      . alum & mag hydroxide-simeth (MAALOX/MYLANTA) 200-200-20 MG/5ML suspension 30 mL  30 mL Oral Q4H PRN Sanjuana Kava, NP      . carbamazepine (TEGRETOL) tablet 200 mg  200 mg Oral Q2000 Sanjuana Kava, NP      . citalopram (CELEXA) tablet 20 mg  20 mg Oral Daily Sanjuana Kava, NP   20 mg at 06/04/12 1657  . magnesium hydroxide (MILK OF MAGNESIA) suspension 30 mL  30 mL Oral Daily PRN Sanjuana Kava, NP      . nicotine (NICODERM CQ - dosed in mg/24 hours) patch  14 mg  14 mg Transdermal Q0600 Sanjuana Kava, NP      . traZODone (DESYREL) tablet 50 mg  50 mg Oral QHS PRN Sanjuana Kava, NP       Facility-Administered Medications Ordered in Other Encounters  Medication Dose Route Frequency Provider Last Rate Last Dose  . DISCONTD: acetaminophen (TYLENOL) tablet 650 mg  650 mg Oral Q4H PRN Angus Seller, PA      . DISCONTD: alum & mag hydroxide-simeth (MAALOX/MYLANTA) 200-200-20 MG/5ML suspension 30 mL  30 mL Oral PRN Angus Seller, PA      . DISCONTD: ibuprofen (ADVIL,MOTRIN) tablet 600 mg  600 mg Oral Q8H PRN Angus Seller, PA      . DISCONTD: LORazepam (ATIVAN) tablet 1 mg  1 mg Oral Q8H PRN Angus Seller, PA      . DISCONTD: nicotine (NICODERM CQ - dosed in mg/24 hours) patch 21 mg  21 mg Transdermal Daily Angus Seller, PA      . DISCONTD: ondansetron (ZOFRAN) tablet 4 mg  4 mg Oral Q8H PRN Angus Seller, PA      . DISCONTD: TDaP (BOOSTRIX) 5-2.5-18.5 LF-MCG/0.5 injection           . DISCONTD: TDaP (BOOSTRIX) injection 0.5 mL  0.5 mL Intramuscular Once Theron Arista  S Dammen, PA      . DISCONTD: zolpidem (AMBIEN) tablet 5 mg  5 mg Oral QHS PRN Angus Seller, PA        Lab Results:  Results for orders placed during the hospital encounter of 06/03/12 (from the past 48 hour(s))  CBC     Status: Normal   Collection Time   06/03/12 11:10 PM      Component Value Range Comment   WBC 5.2  4.0 - 10.5 K/uL    RBC 4.21  3.87 - 5.11 MIL/uL    Hemoglobin 13.7  12.0 - 15.0 g/dL    HCT 16.1  09.6 - 04.5 %    MCV 95.2  78.0 - 100.0 fL    MCH 32.5  26.0 - 34.0 pg    MCHC 34.2  30.0 - 36.0 g/dL    RDW 40.9  81.1 - 91.4 %    Platelets 211  150 - 400 K/uL   DIFFERENTIAL     Status: Abnormal   Collection Time   06/03/12 11:10 PM      Component Value Range Comment   Neutrophils Relative 30 (*) 43 - 77 %    Neutro Abs 1.6 (*) 1.7 - 7.7 K/uL    Lymphocytes Relative 60 (*) 12 - 46 %    Lymphs Abs 3.1  0.7 - 4.0 K/uL    Monocytes Relative 7  3 - 12 %    Monocytes  Absolute 0.4  0.1 - 1.0 K/uL    Eosinophils Relative 2  0 - 5 %    Eosinophils Absolute 0.1  0.0 - 0.7 K/uL    Basophils Relative 0  0 - 1 %    Basophils Absolute 0.0  0.0 - 0.1 K/uL   COMPREHENSIVE METABOLIC PANEL     Status: Abnormal   Collection Time   06/03/12 11:10 PM      Component Value Range Comment   Sodium 138  135 - 145 mEq/L    Potassium 4.1  3.5 - 5.1 mEq/L    Chloride 104  96 - 112 mEq/L    CO2 21  19 - 32 mEq/L    Glucose, Bld 97  70 - 99 mg/dL    BUN 13  6 - 23 mg/dL    Creatinine, Ser 7.82  0.50 - 1.10 mg/dL    Calcium 9.7  8.4 - 95.6 mg/dL    Total Protein 7.8  6.0 - 8.3 g/dL    Albumin 4.6  3.5 - 5.2 g/dL    AST 19  0 - 37 U/L HEMOLYSIS AT THIS LEVEL MAY AFFECT RESULT   ALT 11  0 - 35 U/L    Alkaline Phosphatase 41  39 - 117 U/L    Total Bilirubin 0.3  0.3 - 1.2 mg/dL    GFR calc non Af Amer 88 (*) >90 mL/min    GFR calc Af Amer >90  >90 mL/min   ETHANOL     Status: Normal   Collection Time   06/03/12 11:10 PM      Component Value Range Comment   Alcohol, Ethyl (B) <11  0 - 11 mg/dL   URINALYSIS, ROUTINE W REFLEX MICROSCOPIC     Status: Abnormal   Collection Time   06/03/12 11:11 PM      Component Value Range Comment   Color, Urine AMBER (*) YELLOW BIOCHEMICALS MAY BE AFFECTED BY COLOR   APPearance TURBID (*) CLEAR    Specific Gravity, Urine >1.046 (*)  1.005 - 1.030    pH 5.0  5.0 - 8.0    Glucose, UA NEGATIVE  NEGATIVE mg/dL    Hgb urine dipstick NEGATIVE  NEGATIVE    Bilirubin Urine SMALL (*) NEGATIVE    Ketones, ur NEGATIVE  NEGATIVE mg/dL    Protein, ur NEGATIVE  NEGATIVE mg/dL    Urobilinogen, UA 0.2  0.0 - 1.0 mg/dL    Nitrite NEGATIVE  NEGATIVE    Leukocytes, UA NEGATIVE  NEGATIVE   URINE RAPID DRUG SCREEN (HOSP PERFORMED)     Status: Abnormal   Collection Time   06/03/12 11:11 PM      Component Value Range Comment   Opiates NONE DETECTED  NONE DETECTED    Cocaine NONE DETECTED  NONE DETECTED    Benzodiazepines NONE DETECTED  NONE DETECTED     Amphetamines NONE DETECTED  NONE DETECTED    Tetrahydrocannabinol POSITIVE (*) NONE DETECTED    Barbiturates NONE DETECTED  NONE DETECTED   URINE MICROSCOPIC-ADD ON     Status: Abnormal   Collection Time   06/03/12 11:11 PM      Component Value Range Comment   Squamous Epithelial / LPF MANY (*) RARE    WBC, UA 0-2  <3 WBC/hpf    RBC / HPF 0-2  <3 RBC/hpf   PREGNANCY, URINE     Status: Normal   Collection Time   06/03/12 11:21 PM      Component Value Range Comment   Preg Test, Ur NEGATIVE  NEGATIVE   ACETAMINOPHEN LEVEL     Status: Normal   Collection Time   06/04/12 12:15 AM      Component Value Range Comment   Acetaminophen (Tylenol), Serum 23.9  10 - 30 ug/mL   SALICYLATE LEVEL     Status: Abnormal   Collection Time   06/04/12 12:15 AM      Component Value Range Comment   Salicylate Lvl <2.0 (*) 2.8 - 20.0 mg/dL   ACETAMINOPHEN LEVEL     Status: Normal   Collection Time   06/04/12  3:18 AM      Component Value Range Comment   Acetaminophen (Tylenol), Serum <15.0  10 - 30 ug/mL     Physical Findings: AIMS:  , ,  ,  ,    CIWA:    COWS:     Treatment Plan Summary: Daily contact with patient to assess and evaluate symptoms and progress in treatment Medication management mood/anxiety less than 3/10 where 1 is the best and 10 is teh worst  Plan: Admit, start Celexa for anxiety and depression as well as Tegretol for moods up and down.  Family history of bipolar disorder.  Discussed the risks, benefits, and probable clinical course with and without treatment.  Pt is agreeable to the current course of treatment.  Holly Norton 06/04/2012, 7:14 PM

## 2012-06-04 NOTE — Progress Notes (Signed)
06/04/2012         Time: 1415      Group Topic/Focus: The focus of this group is on enhancing patients' problem solving skills, which involves identifying the problem, brainstorming solutions and choosing and trying a solution.    Participation Level: Active  Participation Quality: Appropriate and Attentive  Affect: Depressed  Cognitive: Oriented   Additional Comments: Patient open about her reason for admission, states she is "glad I changed my mind."   Holly Norton 06/04/2012 3:35 PM

## 2012-06-04 NOTE — ED Notes (Signed)
Behavioral Health at bedside for pt evaluation. 
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
## 2012-06-04 NOTE — ED Provider Notes (Signed)
History     CSN: [id number]  Arrival date & time 06/03/12  2255   First MD Initiated Contact with Patient 06/03/12 2356      Chief Complaint  Patient presents with  . self-inflicted wrist lacs    HPI  History provided by the patient. Patient is a 36 year old female with no significant past medical history who presents with complaints of depression and suicide attempt. Patient states that she has had many stressors recently. Most of which are related to financial situation. Patient currently being evicted from her home and has several other legal issues. Today patient states she felt increased depression and felt like ending her life. Patient uses a razor blade to cut her wrists. Patient also reports taking 8 Tylenol PM this. Patient performing this act around 4 PM today. She denies history of prior suicide attempts. Symptoms are severe. Patient denies any hallucinations or homicidal ideations.    History reviewed. No pertinent past medical history.  History reviewed. No pertinent past surgical history.  No family history on file.  History  Substance Use Topics  . Smoking status: Current Everyday Smoker  . Smokeless tobacco: Not on file  . Alcohol Use: Yes    OB History    Grav Para Term Preterm Abortions TAB SAB Ect Mult Living                  Review of Systems  Unable to perform ROS Constitutional: Positive for fatigue.  Cardiovascular: Negative for chest pain.  Gastrointestinal: Negative for nausea, vomiting and abdominal pain.  Neurological: Negative for dizziness, light-headedness and headaches.  All other systems reviewed and are negative.    Allergies  Review of patient's allergies indicates no known allergies.  Home Medications  No current outpatient prescriptions on file.  BP 115/82  Pulse 106  Temp(Src) 98.2 F (36.8 C) (Oral)  Resp 18  SpO2 100%  LMP 05/13/2012  Physical Exam  Nursing note and vitals reviewed. Constitutional: She is oriented  to person, place, and time. She appears well-developed and well-nourished. No distress.  HENT:  Head: Normocephalic and atraumatic.  Eyes: Conjunctivae and EOM are normal. Pupils are equal, round, and reactive to light.  Cardiovascular: Normal rate and regular rhythm.   Pulmonary/Chest: Effort normal and breath sounds normal. No respiratory distress. She has no wheezes. She has no rales.  Abdominal: Soft. There is no tenderness.  Musculoskeletal: Normal range of motion. She exhibits no edema and no tenderness.       See skin exam  Neurological: She is alert and oriented to person, place, and time.  Skin: Skin is warm and dry.       Superficial lacerations to bilateral wrists. Laceration on right wrist extends through the layers of the skin and approximately 1.5 cm in length. There is no deep structure involvement. Normal range of motion of wrist with normal strength. Normal distal sensations and cap refill.  Psychiatric: Her behavior is normal. She exhibits a depressed mood. She expresses suicidal ideation.    ED Course  Procedures   LACERATION REPAIR Performed by: Angus Seller Authorized by: Angus Seller Consent: Verbal consent obtained. Risks and benefits: risks, benefits and alternatives were discussed Consent given by: patient Patient identity confirmed: provided demographic data Prepped and Draped in normal sterile fashion Wound explored  Laceration Location: Right wrist  Laceration Length: 1.5 cm  No Foreign Bodies seen or palpated  Anesthesia: local infiltration  Local anesthetic: lidocaine 2 % with epinephrine  Anesthetic total: 1 ml  Irrigation method: syringe Amount of cleaning: standard  Skin closure: Skin with 3-0 nylon   Number of sutures: 2   Technique: Simple interrupted   Patient tolerance: Patient tolerated the procedure well with no immediate complications.      Results for orders placed during the hospital encounter of 06/03/12    URINALYSIS, ROUTINE W REFLEX MICROSCOPIC      Component Value Range   Color, Urine AMBER (*) YELLOW   APPearance TURBID (*) CLEAR   Specific Gravity, Urine >1.046 (*) 1.005 - 1.030   pH 5.0  5.0 - 8.0   Glucose, UA NEGATIVE  NEGATIVE mg/dL   Hgb urine dipstick NEGATIVE  NEGATIVE   Bilirubin Urine SMALL (*) NEGATIVE   Ketones, ur NEGATIVE  NEGATIVE mg/dL   Protein, ur NEGATIVE  NEGATIVE mg/dL   Urobilinogen, UA 0.2  0.0 - 1.0 mg/dL   Nitrite NEGATIVE  NEGATIVE   Leukocytes, UA NEGATIVE  NEGATIVE  PREGNANCY, URINE      Component Value Range   Preg Test, Ur NEGATIVE  NEGATIVE  URINE RAPID DRUG SCREEN (HOSP PERFORMED)      Component Value Range   Opiates NONE DETECTED  NONE DETECTED   Cocaine NONE DETECTED  NONE DETECTED   Benzodiazepines NONE DETECTED  NONE DETECTED   Amphetamines NONE DETECTED  NONE DETECTED   Tetrahydrocannabinol POSITIVE (*) NONE DETECTED   Barbiturates NONE DETECTED  NONE DETECTED  CBC      Component Value Range   WBC 5.2  4.0 - 10.5 K/uL   RBC 4.21  3.87 - 5.11 MIL/uL   Hemoglobin 13.7  12.0 - 15.0 g/dL   HCT 84.6  96.2 - 95.2 %   MCV 95.2  78.0 - 100.0 fL   MCH 32.5  26.0 - 34.0 pg   MCHC 34.2  30.0 - 36.0 g/dL   RDW 84.1  32.4 - 40.1 %   Platelets 211  150 - 400 K/uL  DIFFERENTIAL      Component Value Range   Neutrophils Relative 30 (*) 43 - 77 %   Neutro Abs 1.6 (*) 1.7 - 7.7 K/uL   Lymphocytes Relative 60 (*) 12 - 46 %   Lymphs Abs 3.1  0.7 - 4.0 K/uL   Monocytes Relative 7  3 - 12 %   Monocytes Absolute 0.4  0.1 - 1.0 K/uL   Eosinophils Relative 2  0 - 5 %   Eosinophils Absolute 0.1  0.0 - 0.7 K/uL   Basophils Relative 0  0 - 1 %   Basophils Absolute 0.0  0.0 - 0.1 K/uL  COMPREHENSIVE METABOLIC PANEL      Component Value Range   Sodium 138  135 - 145 mEq/L   Potassium 4.1  3.5 - 5.1 mEq/L   Chloride 104  96 - 112 mEq/L   CO2 21  19 - 32 mEq/L   Glucose, Bld 97  70 - 99 mg/dL   BUN 13  6 - 23 mg/dL   Creatinine, Ser 0.27  0.50 - 1.10  mg/dL   Calcium 9.7  8.4 - 25.3 mg/dL   Total Protein 7.8  6.0 - 8.3 g/dL   Albumin 4.6  3.5 - 5.2 g/dL   AST 19  0 - 37 U/L   ALT 11  0 - 35 U/L   Alkaline Phosphatase 41  39 - 117 U/L   Total Bilirubin 0.3  0.3 - 1.2 mg/dL   GFR calc non Af Amer 88 (*) >90 mL/min  GFR calc Af Amer >90  >90 mL/min  ETHANOL      Component Value Range   Alcohol, Ethyl (B) <11  0 - 11 mg/dL  URINE MICROSCOPIC-ADD ON      Component Value Range   Squamous Epithelial / LPF MANY (*) RARE   WBC, UA 0-2  <3 WBC/hpf   RBC / HPF 0-2  <3 RBC/hpf  ACETAMINOPHEN LEVEL      Component Value Range   Acetaminophen (Tylenol), Serum 23.9  10 - 30 ug/mL  SALICYLATE LEVEL      Component Value Range   Salicylate Lvl <2.0 (*) 2.8 - 20.0 mg/dL  ACETAMINOPHEN LEVEL      Component Value Range   Acetaminophen (Tylenol), Serum <15.0  10 - 30 ug/mL       No results found.   1. Suicide attempt       MDM  Patient seen and evaluated. Patient no acute distress.  Laceration of right wrist repaired. Patient moved for psych holding.  Holding orders were placed.  Patient also reports taking a Tylenol PM's.  Tylenol level slightly elevated. A 4 hour level pending.  Spoke with Berna Spare with [facility] act team he will see patient and evaluate.        Angus Seller, [place] 06/04/12 [phone]

## 2012-06-04 NOTE — ED Notes (Signed)
Pt to room at this time, Report received from RN Selena Batten, sitter at bedside and will assume care of pt at this time. Will continue to monitor pt.

## 2012-06-04 NOTE — [facility] Counselor (Signed)
0915:  Pt has been accepted to [facility] by [facility] to [facility], bed 501.02.  All necessary paperwork completed and faxed to appropriate parties.  Dr. And nursing staff notified and agreeable with transfer.  Tamera Punt, LPC

## 2012-06-04 NOTE — H&P (Signed)
Medical/psychiatric screening examination/treatment/procedure(s) were performed by non-physician practitioner and as supervising physician I was immediately available for consultation/collaboration.  I have seen and examined this patient and agree the major elements of this evaluation.  

## 2012-06-04 NOTE — Progress Notes (Signed)
[facility] Group Notes: (Counselor/Nursing/MHT/Case Management/Adjunct) 06/04/2012   @1 :15pm Unmet Needs  Type of Therapy:  Group Therapy  Participation Level:  Limited  Participation Quality:  Appropriate, Attentive   Affect:  Flat  Cognitive:  Appropriate  Insight:  Good  Engagement in Group: Limited  Engagement in Therapy:  Limited  Modes of Intervention:  Support and Exploration  Summary of Progress/Problems: Holly Norton was attentive in group, though she did not speak up very often. She did talk briefly about getting to know oneself as a way of meeting unmet needs, stating that if one does not know what they need or who they are, they cannot have positive relationships with others.  [name] was called out twice by medical staff, but remained engaged while present.  [name] 06/04/2012 3:45 PM

## 2012-06-04 NOTE — H&P (Signed)
Psychiatric Admission Assessment Adult  Patient Identification:  Holly Norton  Date of Evaluation:  06/04/2012  Chief Complaint:  MDD single episode  History of Present Illness: This is a 36 year old African-American female, admitted to [facility] from the [facility] ED with complaints of suicide attempt by laceration to bilateral wrists and overdose on Tylenol PM. Patient reports, "I decided to cut my wrists yesterday afternoon in an effort to die. It was weird and crazy to do it and watch the blood drip down to my lap. I have been depressed all my life, but got even more depressed after I got eviction notice to move out of my apartment. The thought of having to face eviction without any place to go scared the neck out of me. I don't have a job, have not had a job in 6 years. I have no money. I am 6 months behind on my water bills payment. I am faced with a tough financial problems. I don't have stability in my life. I feel so bad because I have a daughter whom I am unable to for provide all her needs properly. That was my reasons for wanting to die. But I realized now that what I did or how I handled the situation was not good. I will never do this again. I was not in my right mind to have to do this to myself. I know I need to be on medication to control my mood swings. Also, my mind does not rest, worst at night. That is why I can't sleep at night. I have to take a lot of tylenol PM tablets to try to get some sleep".  ROS: Patient is alert and oriented x 3. Aware of situations. She currently denies any SOB and or chest pains. Has self inflicted lacerations to bilateral wrists. Lt wrist laceration intact without any drainage. Mild swelling and redness present. Rt. Wrist laceration with dry dressing to it. Patient is able to wiggle all 10 fingers on command.  Mood Symptoms:  Anhedonia, Hopelessness, Mood Swings, Past 2 Weeks, Sadness, SI,  Depression Symptoms:  depressed mood, feelings  of worthlessness/guilt, suicidal thoughts with specific plan, suicidal attempt,  (Hypo) Manic Symptoms:  Impulsivity, Irritable Mood,  Anxiety Symptoms:  Excessive Worry,  Psychotic Symptoms:  Hallucinations: None  PTSD Symptoms: Had a traumatic exposure:  patient denies.  Past Psychiatric History: Diagnosis: Major depressive disorder, recurrent episode, severe  Hospitalizations: [facility]  Outpatient Care: "I don't have one"  Substance Abuse Care: None reported  Self-Mutilation: "Yes, I cut on both my wrists in an effort to die"  Suicidal Attempts: "Yes, buy cutting both my wrists yesterday"  Violent Behaviors: None reported.   Past Medical History:    Past Medical History  Diagnosis Date  . Depression   . Anxiety     Allergies:  No Known Allergies  PTA Medications: No prescriptions prior to admission   Substance Abuse History in the last 12 months: Substance Age of 1st Use Last Use Amount Specific Type  Nicotine 17 Prior to hosp 1/2 a pack daily Cigarettes.  Alcohol 14 "I drink occasionally" "1 or 2 bottles of beer may be once a month"  Beer  Cannabis 17 Prior to hosp    Opiates    "2 joints twice weekly" marijuana  Cocaine Denies use     Methamphetamines Denies use     LSD Denies use     Ecstasy Denies use     Benzodiazepines Denies use  Caffeine      Inhalants      Others:                         Consequences of Substance Abuse: Medical Consequences:  Liver damage Legal Consequences:  Arrests, jail time. Family Consequences:  Family discord  Social History: Current Place of Residence: [place], [place]    Place of Birth:  [place], [place]  Family Members: "I have a daughter"  Marital Status:  Single  Children:1  Sons:0  Daughters:1  Relationships: "I'm single"  Education:  GED  Educational Problems/Performance: "I could not finish high school"  Religious Beliefs/Practices: None indicated  History of Abuse (Emotional/Phsycial/Sexual): None  reported  Occupational Experiences: Unemployed x 6 years.  Military History:  None.  Legal History: None reported  Hobbies/Interests: reported  Family History:  No family history on file.  Mental Status Examination/Evaluation: Objective:  Appearance: Casual and thin with a cut to Lt. wrist and wrist. Rt. wrist wound wiith dressing.  Eye Contact::  Fair  Speech:  Clear and Coherent  Volume:  Normal  Mood:  Depressed  Affect:  Flat  Thought Process:  Coherent and Intact  Orientation:  Full  Thought Content:  Rumination  Suicidal Thoughts:  No  Homicidal Thoughts:  No  Memory:  Immediate;   Good Recent;   Good Remote;   Good  Judgement:  Poor  Insight:  Fair  Psychomotor Activity:  Normal  Concentration:  Good  Recall:  Good  Akathisia:  No  Handed:  Right  AIMS (if indicated):     Assets:  Desire for Improvement  Sleep:       Laboratory/X-Ray: None Psychological Evaluation(s)      Assessment:    AXIS I:  Major depressive disorder, recurrent episode, severe. AXIS II:  Deferred AXIS III:   Past Medical History  Diagnosis Date  . Depression   . Anxiety    AXIS IV:  economic problems, housing problems, occupational problems and other psychosocial or environmental problems AXIS V:  11-20 some danger of hurting self or others possible OR occasionally fails to maintain minimal personal hygiene OR gross impairment in communication  Treatment Plan/Recommendations: Admit for safety and stabilization. Review and reinstate any pertinent home medications for other health conditions. Initiate Citalopram 20 mg daily for depression. Add Tegretol 200 mg Q bedtime for sleep. Obtain Tegretol levels on 06/07/12  Treatment Plan Summary: Daily contact with patient to assess and evaluate symptoms and progress in treatment Medication management  Current Medications:  No current facility-administered medications for this encounter.   Facility-Administered Medications Ordered  in Other Encounters  Medication Dose Route Frequency Provider Last Rate Last Dose  . DISCONTD: acetaminophen (TYLENOL) tablet 650 mg  650 mg Oral Q4H PRN Angus Seller, PA      . DISCONTD: alum & mag hydroxide-simeth (MAALOX/MYLANTA) 200-200-20 MG/5ML suspension 30 mL  30 mL Oral PRN Angus Seller, PA      . DISCONTD: ibuprofen (ADVIL,MOTRIN) tablet 600 mg  600 mg Oral Q8H PRN Angus Seller, PA      . DISCONTD: LORazepam (ATIVAN) tablet 1 mg  1 mg Oral Q8H PRN Angus Seller, PA      . DISCONTD: nicotine (NICODERM CQ - dosed in mg/24 hours) patch 21 mg  21 mg Transdermal Daily Angus Seller, PA      . DISCONTD: ondansetron (ZOFRAN) tablet 4 mg  4 mg Oral Q8H PRN Angus Seller, PA      .  DISCONTD: TDaP (BOOSTRIX) 5-2.5-18.5 LF-MCG/0.5 injection           . DISCONTD: TDaP (BOOSTRIX) injection 0.5 mL  0.5 mL Intramuscular Once Angus Seller, PA      . DISCONTD: zolpidem (AMBIEN) tablet 5 mg  5 mg Oral QHS PRN Angus Seller, PA        Observation Level/Precautions:  Q 15 minutes checks for safety  Laboratory:  Per ED lab findings on file, (+) THC  Psychotherapy:  Group  Medications:  See lists  Routine PRN Medications:  Yes  Consultations:  None indicated at this time.  Discharge Concerns:  Safety  Other:     Sanjuana Kava 6/12/20132:26 PM
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
## 2012-06-04 NOTE — ED Notes (Signed)
Clothes removed and placed in pocketbook.  Pocketbook with label on it.

## 2012-06-04 NOTE — Tx Team (Signed)
Initial Interdisciplinary Treatment Plan  PATIENT STRENGTHS: (choose at least two) Ability for insight Average or above average intelligence General fund of knowledge  PATIENT STRESSORS: Financial difficulties   PROBLEM LIST: Problem List/Patient Goals Date to be addressed Date deferred Reason deferred Estimated date of resolution  Depression      Suicide Attempt                                                 DISCHARGE CRITERIA:  Reduction of life-threatening or endangering symptoms to within safe limits  PRELIMINARY DISCHARGE PLAN: Outpatient therapy  PATIENT/FAMIILY INVOLVEMENT: This treatment plan has been presented to and reviewed with the patient, Holly Norton, and/or family member.  The patient and family have been given the opportunity to ask questions and make suggestions.  [name] [facility] 06/04/2012, 11:43 AM

## 2012-06-05 DIAGNOSIS — F319 Bipolar disorder, unspecified: Secondary | ICD-10-CM

## 2012-06-05 DIAGNOSIS — F429 Obsessive-compulsive disorder, unspecified: Secondary | ICD-10-CM

## 2012-06-05 MED ORDER — RISPERIDONE 1 MG PO TABS
1.0000 mg | ORAL_TABLET | Freq: Every day | ORAL | Status: DC
  Administered 2012-06-05 – 2012-06-06 (×2): 1 mg via ORAL
  Filled 2012-06-05 (×4): qty 1

## 2012-06-05 MED ORDER — RISPERIDONE 0.5 MG PO TABS
0.5000 mg | ORAL_TABLET | Freq: Every day | ORAL | Status: DC
  Administered 2012-06-05 – 2012-06-07 (×3): 0.5 mg via ORAL
  Filled 2012-06-05: qty 1
  Filled 2012-06-05: qty 42
  Filled 2012-06-05: qty 1
  Filled 2012-06-05: qty 42
  Filled 2012-06-05: qty 1
  Filled 2012-06-05: qty 42
  Filled 2012-06-05 (×2): qty 1

## 2012-06-05 NOTE — Progress Notes (Signed)
[facility] MD Progress Note  06/05/2012 2:53 PM  Diagnosis:   Axis I: Bipolar, Depressed and Obsessive Compulsive Disorder Axis II: Deferred Axis III:  Past Medical History  Diagnosis Date  . Depression   . Anxiety     ADL's:  Intact  Sleep: Good  Appetite:  Good  Suicidal Ideation:  Pt denies any thoughts of being dead now, but did attempt to end her life just a day ago. Homicidal Ideation:  Denies adamantly any homicidal thoughts.  Mental Status Examination/Evaluation: Objective:  Appearance: Casual  Eye Contact::  Good  Speech:  Clear and Coherent  Volume:  Normal  Mood:  Euthymic, but confessed that she has a lot of clutter in her apartment to clean out.  She does endorse other features of OCD.  Will change medication regimen to cover that.  Affect:  Congruent  Thought Process:  Coherent  Orientation:  Full  Thought Content:  WDL  Suicidal Thoughts:  No  Homicidal Thoughts:  No  Memory:  Immediate;   Fair  Judgement:  Fair  Insight:  Fair  Psychomotor Activity:  Normal  Concentration:  Fair  Recall:  Fair  Akathisia:  No  Handed:  Right  AIMS (if indicated):     Assets:  Communication Skills Desire for Improvement  Sleep:  Number of Hours: 6.75    ROS: Neuro: no headaches, ataxia, weakness, dizziness  GI: no N/V/D/cramps/constipation  MS: no weakness, muscle cramps, aches.   Vital Signs:Blood pressure 113/80, pulse 102, temperature 97.9 F (36.6 C), temperature source Oral, resp. rate 20, height 5\' 3"  (1.6 m), weight 53.071 kg (117 lb), last menstrual period 05/13/2012. Current Medications: Current Facility-Administered Medications  Medication Dose Route Frequency Provider Last Rate Last Dose  . acetaminophen (TYLENOL) tablet 650 mg  650 mg Oral Q6H PRN Sanjuana Kava, NP      . alum & mag hydroxide-simeth (MAALOX/MYLANTA) 200-200-20 MG/5ML suspension 30 mL  30 mL Oral Q4H PRN Sanjuana Kava, NP      . magnesium hydroxide (MILK OF MAGNESIA) suspension 30 mL   30 mL Oral Daily PRN Sanjuana Kava, NP      . nicotine (NICODERM CQ - dosed in mg/24 hours) patch 14 mg  14 mg Transdermal Q0600 Sanjuana Kava, NP   14 mg at 06/05/12 0639  . risperiDONE (RISPERDAL) tablet 0.5 mg  0.5 mg Oral Daily Mike Craze, MD      . risperiDONE (RISPERDAL) tablet 1 mg  1 mg Oral QHS Mike Craze, MD      . traZODone (DESYREL) tablet 50 mg  50 mg Oral QHS PRN Sanjuana Kava, NP   50 mg at 06/04/12 2222  . DISCONTD: carbamazepine (TEGRETOL) tablet 200 mg  200 mg Oral Q2000 Sanjuana Kava, NP   200 mg at 06/04/12 2222  . DISCONTD: citalopram (CELEXA) tablet 20 mg  20 mg Oral Daily Sanjuana Kava, NP   20 mg at 06/05/12 4098    Lab Results:  Results for orders placed during the hospital encounter of 06/04/12 (from the past 48 hour(s))  CARBAMAZEPINE LEVEL, TOTAL     Status: Abnormal   Collection Time   06/05/12  6:45 AM      Component Value Range Comment   Carbamazepine Lvl 2.7 (*) 4.0 - 12.0 ug/mL     Physical Findings: AIMS:  , ,  ,  ,    CIWA:    COWS:     Treatment Plan Summary: Daily  contact with patient to assess and evaluate symptoms and progress in treatment Medication management mood/anxiety less than 3/10 where 1 is the best and 10 is teh worst  Plan: Pt has features of OCD will switch to Risperdal for mood control as it has coverage for this too. Pt agreeable with the risks and benefits of this change. Discussed Yoga for exercise.  Bill Yohn 06/05/2012, 2:53 PM

## 2012-06-05 NOTE — Progress Notes (Signed)
Patient resting quietly in room upon my assessment. Patient isolates in room this evening, did not attend group. Patient easily arousable, takes medications as directed. Patient cooperative with staff. Patient denies SI/HI, denies A/V hallucinations. Patient offered support and encouragement. Patient remains safe on unit with Q15 minute checks for safety. Will continue to monitor.

## 2012-06-05 NOTE — Progress Notes (Signed)
Patient ID: Holly Norton, female   DOB: [birth year], 36 y.o.   MRN: [id number] Patient was pleasant and cooperative during the assessment, but flat and depressed. Stated she was happy to hear that she was going to be prescribed a mood stabilizer. "My little girl loves to hug and show affection. If she does it and I push her away, she won't understand". States she knows that her mood shifts at different times, and hates when it involves her daughter. "I'm excited about the future, now".

## 2012-06-05 NOTE — Tx Team (Signed)
Interdisciplinary Treatment Plan Update (Adult)  Date:  06/05/2012  Time Reviewed:  10:09 AM   Progress in Treatment: Attending groups: Yes Participating in groups:  Yes Taking medication as prescribed: Yes Tolerating medication:  Yes Family/Significant other contact made: No  Patient understands diagnosis:  Yes Discussing patient identified problems/goals with staff:  Yes Medical problems stabilized or resolved:  Yes Denies suicidal/homicidal ideation: Yes Issues/concerns per patient self-inventory:  None identified Other: N/A  New problem(s) identified: None Identified  Reason for Continuation of Hospitalization: Anxiety Depression Medication stabilization Suicidal ideation  Interventions implemented related to continuation of hospitalization: mood stabilization, medication monitoring and adjustment, group therapy and psycho education, safety checks q 15 mins  Additional comments: N/A  Estimated length of stay: 3-5 days  Discharge Plan: SW will assess for appropriate referrals.  New goal(s): N/A  Review of initial/current patient goals per problem list:    1.  Goal(s): Reduce depressive symptoms  Met:  No  Target date: by discharge  As evidenced by: Reducing depression from a 10 to a 3 as reported by pt.   2.  Goal (s): Reduce/Eliminate suicidal ideation  Met:  No  Target date: by discharge  As evidenced by: pt reporting no SI.    3.  Goal(s): Reduce anxiety symptoms  Met:  No  Target date: by discharge  As evidenced by: Reduce anxiety from a 10 to a 3 as reported by pt.    Attendees: Patient:  Holly Norton 06/05/2012 10:35 AM  Family:     Physician:  Orson Aloe, MD  06/05/2012  10:09 AM   Nursing:   Chinita Greenland, RN 06/05/2012 10:11 AM   Case Manager:  Reyes Ivan, LCSWA 06/05/2012  10:09 AM   Counselor:  Angus Palms, LCSW 06/05/2012  10:09 AM   Other:  Juline Patch, LCSW 06/05/2012  10:09 AM   Other:  Omelia Blackwater, RN 06/05/2012   10:09 AM   Other:     Other:      Scribe for Treatment Team:   Carmina Miller, 06/05/2012 , 10:09 AM

## 2012-06-05 NOTE — Progress Notes (Signed)
Pt is bright and states she is feeling better. Pt states that being her makes her feel safe. Pt states she has gotten some insight out of the groups and knows what changes she needs to make when she goes home. Pt was offered support and encouragement. Pt attends groups and actively participates. Pt receptive to treatment and safety maintained on the unit.

## 2012-06-05 NOTE — Progress Notes (Signed)
[facility] Group Notes: (Counselor/Nursing/MHT/Case Management/Adjunct)  06/05/2012 @1 :15pm  [facility] [place]  Type of Therapy: Group Therapy   Participation Level: Good   Participation Quality: Good   Affect: Appropriate   Cognitive: Appropriate   Insight: Good  Engagement in Group: Good   Engagement in Therapy: Good   Modes of Intervention: Support and Exploration   Summary of Progress/Problems: Holly Norton participated with in exploration types of support and of the recovery model. Was an active listener in discussion of the supports offered by [facility]. She expressed interest in programs [facility] offers.    [name]  06/05/2012 3:06 PM

## 2012-06-05 NOTE — Progress Notes (Signed)
Pt attended discharge planning group and actively participated.  Pt presents with flat affect and depressed mood.  Pt was open with sharing reason for entering the hospital.  Pt states that she has felt depressed for a long time but never got help for it.  Pt states that she got a notice for eviction and got stressed out.  Pt states that cut herself but didn't want to die.  Pt states that she did this as a cry for help.  Pt states that she lives in [address] housing and has a 51 year old daughter.  Pt states that transportation is a big barrier.  Pt states that she doesn't work and has no income.  Pt states that she wants to go to a psychiatrist and therapist but doesn't know how she would get there.  SW discussed [facility] as a good option for her and discussed the bus system for transportation.  Pt states that she does not feel depressed, anxious or SI today due to being in a safe place.  SW will refer pt to [facility] for medication management and therapy.  No further needs voiced by pt at this time.  Safety planning and suicide prevention discussed.     Reyes Ivan, LCSWA 06/05/2012  10:39 AM

## 2012-06-05 NOTE — [facility] Counselor (Signed)
Psychoeducational Group Note  Date:  06/05/2012 Time:  11am  Group Topic/Focus:  Balance  Participation Level:  Active  Participation Quality:  Appropriate  Affect:  Appropriate  Cognitive:  Alert  Insight:  Good  Engagement in Group:  Good  Additional Comments:  Deborra was engaged in group and said she wants to achieve balance in her life. She feels that her depression will get better if she becomes more active. She specifically wants to gain balance regarding eating right and exercising and being more active.   Christy Sartorius. 06/05/2012, 11:58 AM

## 2012-06-06 MED ORDER — RISPERIDONE 0.5 MG PO TABS: ORAL_TABLET | ORAL | Status: DC

## 2012-06-06 MED ORDER — TRAZODONE HCL 50 MG PO TABS: 50.0000 mg | ORAL_TABLET | Freq: Every evening | ORAL | Status: DC | PRN

## 2012-06-06 MED ORDER — NICOTINE 14 MG/24HR TD PT24: 1.0000 | MEDICATED_PATCH | Freq: Every day | TRANSDERMAL | Status: DC

## 2012-06-06 NOTE — Progress Notes (Signed)
[facility] Adult Inpatient Family/Significant Other Suicide Prevention Education  Suicide Prevention Education:  Patient Refusal for Family/Significant Other Suicide Prevention Education: The patient Holly Norton has refused to provide written consent for family/significant other to be provided Family/Significant Other Suicide Prevention Education during admission and/or prior to discharge.  Physician notified.  Pt. accepted information on suicide prevention, warning signs to look for with suicide and crisis line numbers to use. The pt. agreed to call crisis line numbers if having warning signs or having thoughts of suicide.  Pt took an extra suicide prevention brochure to give to friend/family. Pt does not want anyone contacted at this time. Pt agreed to notify staff if this changes.   [facility] 06/06/2012, 2:16 PM

## 2012-06-06 NOTE — Progress Notes (Signed)
06/06/2012         Time: 1415      Group Topic/Focus: The focus of this group is on enhancing the patient's understanding of leisure, barriers to leisure, and the importance of engaging in positive leisure activities upon discharge for improved total health.   Participation Level: Active  Participation Quality: Appropriate and Attentive  Affect: Appropriate  Cognitive: Oriented   Additional Comments: Patient able to identify positive leisure interests to engage in upon discharge.    Akima Slaugh 06/06/2012 3:36 PM
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
## 2012-06-06 NOTE — Tx Team (Signed)
Interdisciplinary Treatment Plan Update (Adult)  Date:  06/06/2012  Time Reviewed:  11:34 AM   Progress in Treatment: Attending groups: Yes Participating in groups:  Yes Taking medication as prescribed: Yes Tolerating medication:  Yes Family/Significant other contact made:   Patient understands diagnosis:  Yes Discussing patient identified problems/goals with staff:  Yes Medical problems stabilized or resolved:  Yes Denies suicidal/homicidal ideation: Yes Issues/concerns per patient self-inventory:  None identified Other: N/A  New problem(s) identified: None Identified  Reason for Continuation of Hospitalization: Medication stabilization  Interventions implemented related to continuation of hospitalization: mood stabilization, medication monitoring and adjustment, group therapy and psycho education, safety checks q 15 mins  Additional comments: N/A  Estimated length of stay: D/C tomorrow  Discharge Plan: Pt will follow up with [facility] for medication management and therapy.    New goal(s): N/A  Review of initial/current patient goals per problem list:    1.  Goal(s): Reduce depressive symptoms  Met:  Yes  Target date: by discharge  As evidenced by: Reducing depression from a 10 to a 3 as reported by pt. Pt denies having depression.   2.  Goal (s): Reduce/Eliminate suicidal ideation  Met:  Yes  Target date: by discharge  As evidenced by: pt reporting no SI.    3.  Goal(s): Reduce anxiety symptoms  Met:  Yes  Target date: by discharge  As evidenced by: Reduce anxiety from a 10 to a 3 as reported by pt. Pt denies having anxiety.    Attendees: Patient:  Holly Norton 06/06/2012 11:42 AM   Family:     Physician:  Orson Aloe, MD  06/06/2012  11:34 AM   Nursing:   Nestor Ramp, RN 06/06/2012 11:42 AM   Case Manager:  Reyes Ivan, LCSWA 06/06/2012  11:34 AM   Counselor: 06/06/2012  11:34 AM   Other: 06/06/2012  11:34 AM   Other: 06/06/2012  11:34 AM   Other:      Other:      Scribe for Treatment Team:   Carmina Miller, 06/06/2012 , 11:34 AM

## 2012-06-06 NOTE — Progress Notes (Signed)
[facility] Group Notes:  (Counselor/Nursing/MHT/Case Management/Adjunct)  06/06/2012 1:15 PM  Type of Therapy:  Group Therapy, Dance/Movement Therapy   Participation Level:  Did Not Attend   Holly Norton  

## 2012-06-06 NOTE — Progress Notes (Signed)
[facility] MD Progress Note  06/06/2012 6:09 PM  Diagnosis:   Axis I: Bipolar, Depressed and Obsessive Compulsive Disorder Axis II: Deferred Axis III:  Past Medical History  Diagnosis Date  . Depression   . Anxiety     ADL's:  Intact  Sleep: Good  Appetite:  Good  Suicidal Ideation:  Pt denies any suicidal thoughts. Homicidal Ideation:  Denies adamantly any homicidal thoughts.  Mental Status Examination/Evaluation: Objective:  Appearance: Casual  Eye Contact::  Good  Speech:  Clear and Coherent  Volume:  Normal  Mood:  Euthymic,   Affect:  Congruent  Thought Process:  Coherent  Orientation:  Full  Thought Content:  WDL  Suicidal Thoughts:  No  Homicidal Thoughts:  No  Memory:  Immediate;   Fair  Judgement:  Good  Insight:  Good  Psychomotor Activity:  Normal  Concentration:  Good  Recall:  Good  Akathisia:  No  Handed:  Right  AIMS (if indicated):     Assets:  Communication Skills Desire for Improvement  Sleep:  Number of Hours: 6    ROS: Neuro: denies ataxia, headaches, or weakness  GI: denies any N/VD/cramps/constipation  MS: denies aches, weakness, or muscle cramps.  Vital Signs:Blood pressure 101/73, pulse 116, temperature 98.8 F (37.1 C), temperature source Oral, resp. rate 16, height 5\' 3"  (1.6 m), weight 53.071 kg (117 lb), last menstrual period 05/13/2012. Current Medications: Current Facility-Administered Medications  Medication Dose Route Frequency Provider Last Rate Last Dose  . acetaminophen (TYLENOL) tablet 650 mg  650 mg Oral Q6H PRN Sanjuana Kava, NP      . alum & mag hydroxide-simeth (MAALOX/MYLANTA) 200-200-20 MG/5ML suspension 30 mL  30 mL Oral Q4H PRN Sanjuana Kava, NP      . magnesium hydroxide (MILK OF MAGNESIA) suspension 30 mL  30 mL Oral Daily PRN Sanjuana Kava, NP      . nicotine (NICODERM CQ - dosed in mg/24 hours) patch 14 mg  14 mg Transdermal Q0600 Sanjuana Kava, NP   14 mg at 06/06/12 0622  . risperiDONE (RISPERDAL) tablet 0.5  mg  0.5 mg Oral Daily Mike Craze, MD   0.5 mg at 06/06/12 0801  . risperiDONE (RISPERDAL) tablet 1 mg  1 mg Oral QHS Mike Craze, MD   1 mg at 06/05/12 2152  . traZODone (DESYREL) tablet 50 mg  50 mg Oral QHS PRN Sanjuana Kava, NP   50 mg at 06/04/12 2222    Lab Results:  Results for orders placed during the hospital encounter of 06/04/12 (from the past 48 hour(s))  CARBAMAZEPINE LEVEL, TOTAL     Status: Abnormal   Collection Time   06/05/12  6:45 AM      Component Value Range Comment   Carbamazepine Lvl 2.7 (*) 4.0 - 12.0 ug/mL     Physical Findings: AIMS:  , ,  ,  ,    CIWA:    COWS:     Treatment Plan Summary: Daily contact with patient to assess and evaluate symptoms and progress in treatment Medication management mood/anxiety less than 3/10 where 1 is the best and 10 is teh worst  Plan: Pt doing much better on the current medications. She has noted how helpful medications and talking therapy can be for her.  She is ready of D/C.  Will plan for in the AM.   Luvina Poirier 06/06/2012, 6:09 PM

## 2012-06-06 NOTE — Progress Notes (Signed)
Patient states that she "feels better".  Rates depression at a 1 and hopelessness at a 1.  Patient is attending most groups and is interacting with staff and peers.  Safety maintained on unit.

## 2012-06-06 NOTE — Progress Notes (Signed)
Psychoeducational Group Note  Date:  06/06/2012 Time:  2235  Group Topic/Focus:  Wrap-Up Group:   The focus of this group is to help patients review their daily goal of treatment and discuss progress on daily workbooks.  Participation Level:  Active  Participation Quality:  Appropriate and Sharing  Affect:  Appropriate  Cognitive:  Appropriate  Insight:  Good  Engagement in Group:  Good  Additional Comments:  Pt attended wrap up group this evening.  Pt aware of discharge 06/07/12.  Encouraged [facility] groups to fill empty times pt was concerned about.  Talked to keeping in contact with support system on discharge.  Aundria Rud, Taimur Fier L 06/06/2012, 11:24 PM

## 2012-06-06 NOTE — Progress Notes (Signed)
Pt attended discharge planning group and actively participated.  Pt presents with calm mood and affect.  Pt denies having depression, anxiety and SI.  Pt reports feeling happy and well today.  Pt discussed plans to stay well when she d/c.  Pt will follow up with [facility] for medication management and therapy.  Provided pt with information on [facility] for transportation to appointments.  Pt is stable to d/c tomorrow.  Provided pt with a bus pass for transportation home.  No further needs voiced by pt at this time.    Reyes Ivan, LCSWA 06/06/2012  11:16 AM

## 2012-06-07 NOTE — Progress Notes (Signed)
Patient ID: Holly Norton, female   DOB: 1976-05-26, 36 y.o.   MRN: [id number]   Patient lying in bed with eyes closed. Respirations even and non-labored. Staff will monitor.

## 2012-06-07 NOTE — Progress Notes (Signed)
[facility] Case Management Discharge Plan:  Will you be returning to the same living situation after discharge: Yes,   At discharge, do you have transportation home?:Yes,  Bus pass given to pt. Do you have the ability to pay for your medications:No.  Interagency Information:     Release of information consent forms completed and in the chart;  Patient's signature needed at discharge.  Patient to Follow up at:  Follow-up Information    Follow up with [facility] on 06/17/2012. (Appointment scheduled at 8:00 AM with Dr. Eulah Pont)    Contact information:   [address], [place] [address] [phone]      Follow up with [facility] ([place]). (Call at least 48 hours prior to appointment)    Contact information:   [phone]         Patient denies SI/HI:   Yes,      Safety Planning and Suicide Prevention discussed:  Yes,    Barrier to discharge identified:No.  Summary and Recommendations: Pt. Will follow up with appointments and was given [facility] phamphlet and crisis numbers and agrred to use thm if needed. Pt. Was also given a list og free support groups located in [place].   Holly Norton 06/07/2012, 12:02 PM

## 2012-06-07 NOTE — Progress Notes (Signed)
[facility] Group Notes:  (Counselor/Nursing/MHT/Case Management/Adjunct)  06/07/2012 9:59 AM  Type of Therapy:  After Care Planning  Group  Pt. participated in after care planning group and was given the [place] suicide prevention information and crisis hot line numbers to call in case of emergency. Pt. agreed to [place] the numbers if needed.  Pt. Spoke about feeling good today. Pt. Denied SI/HI today and stated she saw the doctor and that she would be d/c today. Pt. Stated she was having a good day and was excited about going home today. Pt. Had no other questions or concerns.  Lamar Blinks [place] 06/07/2012, 9:59 AM

## 2012-06-07 NOTE — Progress Notes (Signed)
Nursing DC DAR D MD completed DC order and suicide risk assessment in pt's chart. A This nurse gave pt AVS, went over DC plans and pt stated she understood and will comply. Pt denied SI, HI, and / or presence of audit, vis, tactile halluc. Pt  Completed her self inventory and on it she wrote she denied SI, she rated her depression and hopelessness " 1 / 1 " and stated her DC plan was to : " continue the schedule Im currently on here and include more activities". Pt's belongings from her locker were returned to her and she was excorted to bldg entrance and given a bus ticket for transportation home and pt DC'd. PD RN [facility]

## 2012-06-10 NOTE — Progress Notes (Signed)
Patient Discharge Instructions:  After Visit Summary (AVS):   Faxed to:  61/17/2013 Psychiatric Admission Assessment Note:   Faxed to:  06/09/2012 Suicide Risk Assessment - Discharge Assessment:   Faxed to:  06/09/2012 Faxed/Sent to the Next Level Care provider:  06/09/2012  Faxed information to [facility] at [phone]  Karleen Hampshire Brittini, 06/10/2012, 12:00 PM

## 2012-06-17 NOTE — Discharge Summary (Signed)
Physician Discharge Summary Note  Patient:  Holly Norton is an 36 y.o., female MRN:  [id number] DOB:  18-Sep-1976 Patient phone:  [phone] (home)  Patient address:   [address] [place] [address],   Date of Admission:  06/04/2012 Date of Discharge: 06/07/2012  Discharge Diagnoses: Principal Problem:  *Bipolar mood disorder Active Problems:  Suicide attempt  Obsessive compulsive disorder   Axis Diagnosis:   AXIS I:  Bipolar, Depressed and Obsessive Compulsive Disorder AXIS II:  Deferred AXIS III:   Past Medical History  Diagnosis Date  . Depression   . Anxiety    AXIS IV:  other psychosocial or environmental problems AXIS V:  51-60 moderate symptoms  Level of Care:  OP  Hospital Course:   This is a 36 year old African-American female, admitted to [facility] from the [facility] ED with complaints of suicide attempt by laceration to bilateral wrists and overdose on Tylenol PM. Patient reports, "I decided to cut my wrists yesterday afternoon in an effort to die. It was weird and crazy to do it and watch the blood drip down to my lap. I have been depressed all my life, but got even more depressed after I got eviction notice to move out of my apartment. The thought of having to face eviction without any place to go scared the neck out of me. I don't have a job, have not had a job in 6 years. I have no money. I am 6 months behind on my water bills payment. I am faced with a tough financial problems. I don't have stability in my life. I feel so bad because I have a daughter whom I am unable to for provide all her needs properly. That was my reasons for wanting to die. But I realized now that what I did or how I handled the situation was not good. I will never do this again. I was not in my right mind to have to do this to myself. I know I need to be on medication to control my mood swings. Also, my mind does not rest, worst at night. That is why I can't sleep at night. I have  to take a lot of tylenol PM tablets to try to get some sleep".  While a patient in this hospital, Ms. Wixted received medication management for her bipolar disorder, insomnia, and OCD. They were ordered and received Risperdal and Trazodone for those conditions. They were also enrolled in group counseling sessions and activities in which they participated actively.   Patient attended treatment team meeting this am and met with treatment team members. Pt symptoms, treatment plan and response to treatment discussed. Ms. Cove endorsed that their symptoms have improved. Pt also stated that they are stable for discharge. In other to maintain her mood control and sleep pattern, they will continue psychiatric care on outpatient basis. They will follow-up at [facility] along with [facility].  Upon discharge, patient adamantly denies suicidal, homicidal ideations, auditory, visual hallucinations and or delusional thinking. She concluded that she needs to breath and think before she acts. She notes that she has got to get better and that she has discovered that medications ans talking therapy can be very helpful for her. She left [facility] with all personal belongings via public transportation in no apparent distress.  Consults:  None  Significant Diagnostic Studies:  labs: Tegretol level of 2.7  Discharge Vitals:   Blood pressure 125/79, pulse 108, temperature 98 F (36.7 C), temperature  source Oral, resp. rate 16, height 5\' 3"  (1.6 m), weight 53.071 kg (117 lb), last menstrual period 05/13/2012.  Mental Status Exam: See Mental Status Examination and Suicide Risk Assessment completed by Attending Physician prior to discharge.  Discharge destination:  Home  Is patient on multiple antipsychotic therapies at discharge:  No   Has Patient had three or more failed trials of antipsychotic monotherapy by history: N/A Recommended Plan for Multiple Antipsychotic Therapies: N/A  Medication List  As of  06/17/2012 11:08 PM   TAKE these medications      Indication    nicotine 14 mg/24hr patch   Commonly known as: NICODERM CQ - dosed in mg/24 hours   Place 1 patch onto the skin daily at 6 (six) AM. For smoking cessation.       risperiDONE 0.5 MG tablet   Commonly known as: RISPERDAL   Take by mouth ONE every AM and TWO every night for mood regulation and control of anxiety.       traZODone 50 MG tablet   Commonly known as: DESYREL   Take 1 tablet (50 mg total) by mouth at bedtime as needed (insomnia).            Follow-up Information    Follow up with [facility] on 06/17/2012. (Appointment scheduled at 8:00 AM with Dr. Eulah Pont)    Contact information:   [address] [place], [place] [address] [phone]      Follow up with [facility] ([place]). (Call at least 48 hours prior to appointment)    Contact information:   [phone]        Follow-up recommendations:   Activities: Resume typical activities Diet: Resume typical diet Other: Follow up with outpatient provider and report any side effects to out patient prescriber.  SignedDan Humphreys, Yashar Inclan 06/17/2012, 11:08 PM

## 2012-07-04 ENCOUNTER — Emergency Department (HOSPITAL_COMMUNITY)
Admission: EM | Admit: 2012-07-04 | Discharge: 2012-07-04 | Disposition: A | Discharge status: Home / Self Care | Payer: Medicaid Other | Attending: Emergency Medicine | Admitting: Emergency Medicine

## 2012-07-04 ENCOUNTER — Encounter (HOSPITAL_COMMUNITY): Payer: Self-pay | Admitting: Emergency Medicine

## 2012-07-04 DIAGNOSIS — F329 Major depressive disorder, single episode, unspecified: Secondary | ICD-10-CM | POA: Insufficient documentation

## 2012-07-04 DIAGNOSIS — F17200 Nicotine dependence, unspecified, uncomplicated: Secondary | ICD-10-CM | POA: Insufficient documentation

## 2012-07-04 DIAGNOSIS — K0889 Other specified disorders of teeth and supporting structures: Secondary | ICD-10-CM

## 2012-07-04 DIAGNOSIS — F3289 Other specified depressive episodes: Secondary | ICD-10-CM | POA: Insufficient documentation

## 2012-07-04 DIAGNOSIS — K089 Disorder of teeth and supporting structures, unspecified: Secondary | ICD-10-CM | POA: Insufficient documentation

## 2012-07-04 DIAGNOSIS — F411 Generalized anxiety disorder: Secondary | ICD-10-CM | POA: Insufficient documentation

## 2012-07-04 MED ORDER — OXYCODONE-ACETAMINOPHEN 5-325 MG PO TABS: 1.0000 | ORAL_TABLET | Freq: Four times a day (QID) | ORAL | Status: AC | PRN

## 2012-07-04 MED ORDER — PENICILLIN V POTASSIUM 500 MG PO TABS: 500.0000 mg | ORAL_TABLET | Freq: Four times a day (QID) | ORAL | Status: AC

## 2012-07-04 MED ORDER — PENICILLIN V POTASSIUM 250 MG PO TABS
500.0000 mg | ORAL_TABLET | Freq: Once | ORAL | Status: AC
  Administered 2012-07-04: 500 mg via ORAL
  Filled 2012-07-04: qty 2

## 2012-07-04 MED ORDER — OXYCODONE-ACETAMINOPHEN 5-325 MG PO TABS
1.0000 | ORAL_TABLET | Freq: Once | ORAL | Status: AC
  Administered 2012-07-04: 1 via ORAL
  Filled 2012-07-04: qty 1

## 2012-07-04 NOTE — ED Notes (Signed)
Patient presents with c/o left lower tooth pain.  Had fillings put in but missed her appointment that was scheduled this past Tuesday.

## 2012-07-04 NOTE — ED Notes (Signed)
Patient here with dental pain for one week.  Patient had fillings put in last week.

## 2012-07-04 NOTE — ED Provider Notes (Addendum)
History     CSN: [id number]  Arrival date & time 07/04/12  0419   First MD Initiated Contact with Patient 07/04/12 0441      Chief Complaint  Patient presents with  . Dental Pain    (Consider location/radiation/quality/duration/timing/severity/associated sxs/prior treatment) HPI Comments: Pt had 2 or 3 fillings last week by dentist and persistent dental pain since.  Pt spoke to the dentist who placed her on oral mouth wash without improvement.  Patient is a 36 y.o. female presenting with tooth pain. The history is provided by the patient.  Dental PainThe primary symptoms include mouth pain. The symptoms began more than 1 week ago. The symptoms are worsening. The symptoms are new. The symptoms occur constantly.  Affected locations include: teeth and gum(s). At its highest the mouth pain was at 9/10. The mouth pain is currently at 9/10.  Additional symptoms include: gum tenderness, jaw pain and ear pain. Additional symptoms do not include: gum swelling and facial swelling. Medical issues include: smoking.    Past Medical History  Diagnosis Date  . Depression   . Anxiety     History reviewed. No pertinent past surgical history.  No family history on file.  History  Substance Use Topics  . Smoking status: Current Everyday Smoker  . Smokeless tobacco: Not on file  . Alcohol Use: Yes     occasional    OB History    Grav Para Term Preterm Abortions TAB SAB Ect Mult Living                  Review of Systems  HENT: Positive for ear pain. Negative for facial swelling.   All other systems reviewed and are negative.    Allergies  Review of patient's allergies indicates no known allergies.  Home Medications   Current Outpatient Rx  Name Route Sig Dispense Refill  . NICOTINE 14 MG/24HR TD PT24 Transdermal Place 1 patch onto the skin daily at 6 (six) AM. For smoking cessation. 28 patch 0  . RISPERIDONE 0.5 MG PO TABS  Take by mouth ONE every AM and TWO every night for  mood regulation and control of anxiety. 90 tablet 0  . TRAZODONE HCL 50 MG PO TABS Oral Take 1 tablet (50 mg total) by mouth at bedtime as needed (insomnia). 30 tablet 0    BP 116/81  Temp 98.5 F (36.9 C) (Oral)  Resp 18  SpO2 99%  Physical Exam  Nursing note and vitals reviewed. Constitutional: She is oriented to person, place, and time. She appears well-developed and well-nourished. No distress.  HENT:  Head: Normocephalic and atraumatic. No trismus in the jaw.  Right Ear: Tympanic membrane and ear canal normal.  Left Ear: Tympanic membrane and ear canal normal.  Mouth/Throat: No oral lesions. No dental abscesses, uvula swelling or dental caries.         Tenderness with palpation along indicated teeth  Eyes: EOM are normal. Pupils are equal, round, and reactive to light.  Neck: Normal range of motion. Neck supple.  Cardiovascular: Normal rate, regular rhythm, normal heart sounds and intact distal pulses.  Exam reveals no friction rub.   No murmur heard. Pulmonary/Chest: Effort normal and breath sounds normal. She has no wheezes. She has no rales.  Abdominal: Soft. Bowel sounds are normal. She exhibits no distension. There is no tenderness. There is no rebound and no guarding.  Musculoskeletal: Normal range of motion. She exhibits no tenderness.       No edema  Lymphadenopathy:    She has no cervical adenopathy.  Neurological: She is alert and oriented to person, place, and time. No cranial nerve deficit.  Skin: Skin is warm and dry. No rash noted.  Psychiatric: She has a normal mood and affect. Her behavior is normal.    ED Course  Procedures (including critical care time)  Labs Reviewed - No data to display No results found.   1. Pain, dental       MDM   Pt with dental pain after several fillings last week which is not improving.  No signs of ludwig's angina or difficulty swallowing and no systemic symptoms. Will treat with PCN and have pt f/u with  dentist.        Gwyneth Sprout, MD 07/04/12 1610  Gwyneth Sprout, MD 07/04/12 0500

## 2013-01-08 ENCOUNTER — Encounter (HOSPITAL_COMMUNITY): Payer: Self-pay | Admitting: Emergency Medicine

## 2013-01-08 ENCOUNTER — Emergency Department (HOSPITAL_COMMUNITY)
Admission: EM | Admit: 2013-01-08 | Discharge: 2013-01-08 | Disposition: A | Discharge status: Home / Self Care | Payer: Medicaid Other | Attending: Emergency Medicine | Admitting: Emergency Medicine

## 2013-01-08 DIAGNOSIS — Z3202 Encounter for pregnancy test, result negative: Secondary | ICD-10-CM | POA: Insufficient documentation

## 2013-01-08 DIAGNOSIS — F329 Major depressive disorder, single episode, unspecified: Secondary | ICD-10-CM | POA: Insufficient documentation

## 2013-01-08 DIAGNOSIS — S61519A Laceration without foreign body of unspecified wrist, initial encounter: Secondary | ICD-10-CM

## 2013-01-08 DIAGNOSIS — F17200 Nicotine dependence, unspecified, uncomplicated: Secondary | ICD-10-CM | POA: Insufficient documentation

## 2013-01-08 DIAGNOSIS — X789XXA Intentional self-harm by unspecified sharp object, initial encounter: Secondary | ICD-10-CM | POA: Insufficient documentation

## 2013-01-08 DIAGNOSIS — F3289 Other specified depressive episodes: Secondary | ICD-10-CM | POA: Insufficient documentation

## 2013-01-08 DIAGNOSIS — Z79899 Other long term (current) drug therapy: Secondary | ICD-10-CM | POA: Insufficient documentation

## 2013-01-08 DIAGNOSIS — F1210 Cannabis abuse, uncomplicated: Secondary | ICD-10-CM | POA: Insufficient documentation

## 2013-01-08 DIAGNOSIS — F39 Unspecified mood [affective] disorder: Secondary | ICD-10-CM | POA: Insufficient documentation

## 2013-01-08 DIAGNOSIS — Z23 Encounter for immunization: Secondary | ICD-10-CM | POA: Insufficient documentation

## 2013-01-08 DIAGNOSIS — F411 Generalized anxiety disorder: Secondary | ICD-10-CM | POA: Insufficient documentation

## 2013-01-08 DIAGNOSIS — S61509A Unspecified open wound of unspecified wrist, initial encounter: Secondary | ICD-10-CM | POA: Insufficient documentation

## 2013-01-08 LAB — COMPREHENSIVE METABOLIC PANEL
ALT: 5 U/L (ref 0–35)
Alkaline Phosphatase: 47 U/L (ref 39–117)
BUN: 8 mg/dL (ref 6–23)
CO2: 22 mEq/L (ref 19–32)
Chloride: 102 mEq/L (ref 96–112)
GFR calc Af Amer: >90 mL/min (ref >90)
GFR calc non Af Amer: >90 mL/min (ref >90)
Glucose, Bld: 83 mg/dL (ref 70–99)
Potassium: 3.5 mEq/L (ref 3.5–5.1)
Sodium: 136 mEq/L (ref 135–145)
Total Bilirubin: 0.3 mg/dL (ref 0.3–1.2)

## 2013-01-08 LAB — CBC WITH DIFFERENTIAL/PLATELET
Eosinophils Absolute: 0.1 10*3/uL (ref 0.0–0.7)
Hemoglobin: 12.6 g/dL (ref 12.0–15.0)
Lymphocytes Relative: 40 % (ref 12–46)
Lymphs Abs: 2.1 10*3/uL (ref 0.7–4.0)
MCH: 33.1 pg (ref 26.0–34.0)
Monocytes Relative: 7 % (ref 3–12)
Neutrophils Relative %: 52 % (ref 43–77)
Platelets: 239 10*3/uL (ref 150–400)
RBC: 3.81 MIL/uL — ABNORMAL LOW (ref 3.87–5.11)
WBC: 5.4 10*3/uL (ref 4.0–10.5)

## 2013-01-08 LAB — RAPID URINE DRUG SCREEN, HOSP PERFORMED
Amphetamines: NOT DETECTED
Opiates: NOT DETECTED

## 2013-01-08 LAB — POCT PREGNANCY, URINE: Preg Test, Ur: NEGATIVE

## 2013-01-08 LAB — ETHANOL: Alcohol, Ethyl (B): <11 mg/dL (ref 0–11)

## 2013-01-08 MED ORDER — TETANUS-DIPHTH-ACELL PERTUSSIS 5-2.5-18.5 LF-MCG/0.5 IM SUSP
0.5000 mL | Freq: Once | INTRAMUSCULAR | Status: AC
  Administered 2013-01-08: 0.5 mL via INTRAMUSCULAR
  Filled 2013-01-08: qty 0.5

## 2013-01-08 NOTE — ED Provider Notes (Signed)
History     CSN: [id number]  Arrival date & time 01/08/13  1526   First MD Initiated Contact with Patient 01/08/13 1657      Chief Complaint  Patient presents with  . Laceration    (Consider location/radiation/quality/duration/timing/severity/associated sxs/prior treatment) HPI Comments: 37 y/o female presents to the ED today complaining of a laceration to left wrist.  Patient states that she intentionally cut her wrist at 10 am this morning with a used razor blade so that she could feel some relief from her feelings.  She describes her feelings as depressed and anxious.  Patient states that this is only the second time that she has cut herself.  Her prior episode of cutting occurred several months ago.  Patient states that she was seeing a therapist and taking medications, but couldn't keep up with her appointments.  As per RN Mylo Red the patient was seeing [facility]  Patient states that she was taking Risperidrol, Wellbutrin, and Prozac with little relief of her symptoms.  Patient stopped taking medication in March.  Patient states that her memories increase her anxious and depressed feelings.  Patient denies suicidal or homicidal ideations.  Denies any recent alcohol use or recreational drug use.  No physical complaints at this time.  Patient is a 37 y.o. female presenting with skin laceration. The history is provided by the patient. No language interpreter was used.  Laceration     Past Medical History  Diagnosis Date  . Depression   . Anxiety     History reviewed. No pertinent past surgical history.  No family history on file.  History  Substance Use Topics  . Smoking status: Current Every Day Smoker  . Smokeless tobacco: Not on file  . Alcohol Use: Yes     Comment: occasional    OB History    Grav Para Term Preterm Abortions TAB SAB Ect Mult Living                  Review of Systems  Skin: Positive for wound.  Psychiatric/Behavioral: Positive for self-injury  and dysphoric mood. Negative for suicidal ideas. The patient is nervous/anxious.   All other systems reviewed and are negative.    Allergies  Review of patient's allergies indicates no known allergies.  Home Medications   Current Outpatient Rx  Name  Route  Sig  Dispense  Refill  . ACETAMINOPHEN 500 MG PO TABS   Oral   Take 4,000 mg by mouth daily as needed. For pain           BP 95/77  Pulse 86  Temp 98.5 F (36.9 C) (Oral)  Resp 12  SpO2 100%  LMP 12/26/2012  Physical Exam  Nursing note and vitals reviewed. Constitutional: She is oriented to person, place, and time. She appears well-developed and well-nourished.  HENT:  Head: Normocephalic and atraumatic.  Eyes: Conjunctivae normal and EOM are normal. Pupils are equal, round, and reactive to light. Right eye exhibits no discharge. Left eye exhibits no discharge. No scleral icterus.  Neck: Normal range of motion. Neck supple.  Cardiovascular: Normal rate, regular rhythm and normal heart sounds.  Exam reveals no gallop and no friction rub.   No murmur heard. Pulmonary/Chest: Effort normal and breath sounds normal. No respiratory distress. She has no wheezes. She has no rales. She exhibits no tenderness.  Musculoskeletal:       Left wrist: She exhibits laceration. She exhibits normal range of motion.       Full active  ROM of finger flexion and extension, opposition, and wrist flexion and extension.  L Opposition 5/5. All L finger flexion 5/5, All L finger extension 5/5.      Neurological: She is alert and oriented to person, place, and time. No sensory deficit.  Skin: Skin is warm and dry. Laceration noted.       Superficial laceration of the volar aspect of the left wrist.  No active bleeding.  Psychiatric: Her speech is delayed. She is slowed. She exhibits a depressed mood. She expresses no homicidal and no suicidal ideation. She expresses no suicidal plans and no homicidal plans.       Flat affect Poor eye contact     ED Course  Procedures (including critical care time)   Labs Reviewed  CBC WITH DIFFERENTIAL  COMPREHENSIVE METABOLIC PANEL  URINE RAPID DRUG SCREEN (HOSP PERFORMED)  ETHANOL   No results found.   No diagnosis found.  LACERATION REPAIR Performed by: Anne Shutter, Itzamar Traynor Authorized by: Anne Shutter, Herbert Seta Consent: Verbal consent obtained. Risks and benefits: risks, benefits and alternatives were discussed Consent given by: patient Patient identity confirmed: provided demographic data Prepped and Draped in normal sterile fashion Wound explored  Laceration Location: volar aspect of left wrist  Laceration Length: 3 cm  No Foreign Bodies seen or palpated   Irrigation method: syringe Amount of cleaning: standard  Skin closure: Dermabond  Patient tolerance: Patient tolerated the procedure well with no immediate complications.  MDM  Patient presenting with a self induced superficial laceration of the wrist.  Laceration cleaned well and repaired with Dermabond.  Tetanus updated.  Patient denies SI or HI at this time.  However, affect is very flat and behavior withdrawn.  Therefore, patient moved to Pod C and Telepsych consult performed.          Pascal Lux [place], PA-C 01/09/13 1929

## 2013-01-08 NOTE — ED Notes (Signed)
States has hx of depression and is very depressed- was being seen at [facility] but states was not able to keep appts due to no transportation.

## 2013-01-08 NOTE — ED Notes (Signed)
Spoke with charge nurse regarding transfer of pt to psych area.  Pt has 35 yr old daughter with her and doesn't have anyone to pick her up.

## 2013-01-08 NOTE — ED Notes (Signed)
Pt st's she cut her left wrist this am so she could feel relief.  St's she has did this in the past and it made her feel better.  Pt denies SI or HI.  No bleeding at this time.

## 2013-01-11 NOTE — ED Provider Notes (Signed)
Medical screening examination/treatment/procedure(s) were performed by non-physician practitioner and as supervising physician I was immediately available for consultation/collaboration.   Gwyneth Sprout, MD 01/11/13 1536

## 2016-08-25 ENCOUNTER — Telehealth: Payer: Self-pay | Admitting: *Deleted

## 2016-08-25 ENCOUNTER — Encounter: Payer: Self-pay | Admitting: *Deleted

## 2016-08-25 LAB — LAB REPORT - SCANNED: PAP SMEAR: NEGATIVE

## 2016-08-30 NOTE — Telephone Encounter (Signed)
Opened in error
